# Patient Record
Sex: Female | Born: 1955 | Race: White | Hispanic: No | Marital: Married | Smoking: Never smoker
Health system: Southern US, Community
[De-identification: ages and names within clinical notes are randomized; demographics above are authoritative.]

## PROBLEM LIST (undated history)

## (undated) DIAGNOSIS — E785 Hyperlipidemia, unspecified: Secondary | ICD-10-CM

## (undated) DIAGNOSIS — IMO0002 Reserved for concepts with insufficient information to code with codable children: Secondary | ICD-10-CM

## (undated) DIAGNOSIS — B019 Varicella without complication: Secondary | ICD-10-CM

## (undated) DIAGNOSIS — H269 Unspecified cataract: Secondary | ICD-10-CM

## (undated) DIAGNOSIS — M1990 Unspecified osteoarthritis, unspecified site: Secondary | ICD-10-CM

## (undated) DIAGNOSIS — R011 Cardiac murmur, unspecified: Secondary | ICD-10-CM

## (undated) HISTORY — DX: Hyperlipidemia, unspecified: E78.5

## (undated) HISTORY — DX: Unspecified cataract: H26.9

## (undated) HISTORY — DX: Unspecified osteoarthritis, unspecified site: M19.90

## (undated) HISTORY — PX: JOINT REPLACEMENT: SHX530

## (undated) HISTORY — PX: SPINE SURGERY: SHX786

## (undated) HISTORY — PX: FRACTURE SURGERY: SHX138

## (undated) HISTORY — DX: Varicella without complication: B01.9

## (undated) HISTORY — DX: Cardiac murmur, unspecified: R01.1

## (undated) HISTORY — PX: EYE SURGERY: SHX253

## (undated) HISTORY — DX: Reserved for concepts with insufficient information to code with codable children: IMO0002

---

## 2016-05-05 ENCOUNTER — Ambulatory Visit: Payer: Self-pay | Admitting: Family Medicine

## 2016-05-11 ENCOUNTER — Ambulatory Visit: Payer: Self-pay | Admitting: Family Medicine

## 2016-06-15 ENCOUNTER — Ambulatory Visit (INDEPENDENT_AMBULATORY_CARE_PROVIDER_SITE_OTHER): Payer: Medicare Other | Admitting: Family Medicine

## 2016-06-15 ENCOUNTER — Encounter: Payer: Self-pay | Admitting: Family Medicine

## 2016-06-15 VITALS — BP 130/80 | HR 60 | Resp 12 | Ht 66.25 in | Wt 199.4 lb

## 2016-06-15 DIAGNOSIS — Z6833 Body mass index (BMI) 33.0-33.9, adult: Secondary | ICD-10-CM | POA: Insufficient documentation

## 2016-06-15 DIAGNOSIS — Z6831 Body mass index (BMI) 31.0-31.9, adult: Secondary | ICD-10-CM

## 2016-06-15 DIAGNOSIS — M545 Low back pain, unspecified: Secondary | ICD-10-CM

## 2016-06-15 DIAGNOSIS — M5440 Lumbago with sciatica, unspecified side: Secondary | ICD-10-CM

## 2016-06-15 DIAGNOSIS — E785 Hyperlipidemia, unspecified: Secondary | ICD-10-CM | POA: Diagnosis not present

## 2016-06-15 DIAGNOSIS — Z23 Encounter for immunization: Secondary | ICD-10-CM

## 2016-06-15 DIAGNOSIS — M5410 Radiculopathy, site unspecified: Secondary | ICD-10-CM | POA: Insufficient documentation

## 2016-06-15 LAB — LIPID PANEL
CHOL/HDL RATIO: 4
Cholesterol: 229 mg/dL — ABNORMAL HIGH (ref 0–200)
HDL: 54.9 mg/dL (ref >39.00)
LDL Cholesterol: 152 mg/dL — ABNORMAL HIGH (ref 0–99)
NONHDL: 174.22
TRIGLYCERIDES: 112 mg/dL (ref 0.0–149.0)
VLDL: 22.4 mg/dL (ref 0.0–40.0)

## 2016-06-15 LAB — BASIC METABOLIC PANEL
BUN: 18 mg/dL (ref 6–23)
CALCIUM: 9.4 mg/dL (ref 8.4–10.5)
CHLORIDE: 104 meq/L (ref 96–112)
CO2: 30 mEq/L (ref 19–32)
CREATININE: 0.68 mg/dL (ref 0.40–1.20)
GFR: 94.39 mL/min (ref >60.00)
Glucose, Bld: 94 mg/dL (ref 70–99)
POTASSIUM: 4.4 meq/L (ref 3.5–5.1)
Sodium: 140 mEq/L (ref 135–145)

## 2016-06-15 LAB — TSH: TSH: 1.4 u[IU]/mL (ref 0.35–4.50)
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
## 2016-06-15 NOTE — Progress Notes (Signed)
Pre visit review using our clinic review tool, if applicable. No additional management support is needed unless otherwise documented below in the visit note. 

## 2016-06-15 NOTE — Patient Instructions (Addendum)
A few things to remember from today's visit:   BMI 31.0-31.9,adult - Plan: TSH, Basic metabolic panel  Low back pain potentially associated with radiculopathy - Plan: DG Lumbar Spine Complete  Hyperlipidemia, unspecified hyperlipidemia type - Plan: TSH, Lipid panel   What are some tips for weight loss? People become overweight for many reasons. Weight issues can run in families. They can be caused by unhealthy behaviors and a person's environment. Certain health problems and medicines can also lead to weight gain. There are some simple things you can do to reach and maintain a healthy weight:  Eat small more frequent healthy meals instead 3 bid meals. Also Weight Watchers is a good option. Avoid sweet drinks. These include regular soft drinks, fruit juices, fruit drinks, energy drinks, sweetened iced tea, and flavored milk. Avoid fast foods. Fast foods such as french fries, hamburgers, chicken nuggets, and pizza are high in calories and can cause weight gain. Eat a healthy breakfast. People who skip breakfast tend to weigh more. Don't watch more than two hours of television per day. Chew sugar-free gum between meals to cut down on snacking. Avoid grocery shopping when you're hungry. Pack a healthy lunch instead of eating out to control what and how much you eat. Eat a lot of fruits and vegetables. Aim for about 2 cups of fruit and 2 to 3 cups of vegetables per day. Aim for 150 minutes per week of moderate-intensity exercise (such as brisk walking), or 75 minutes per week of vigorous exercise (such as jogging or running). OR 15-30 min of daily brisk walking. Be more active. Small changes in physical activity can easily be added to your daily routine. For example, take the stairs instead of the elevator. Take a walk with your family. A daily walk is a great way to get exercise and to catch up on the day's events.   We have ordered labs or studies at this visit.  It can take up to 1-2 weeks  for results and processing. IF results require follow up or explanation, we will call you with instructions. Clinically stable results will be released to your [facility]. If you have not heard from us or cannot find your results in [facility] in 2 weeks please contact our office at [phone].  If you are not yet signed up for [facility], please consider signing up   Please schedule a gyn/physical exam with us or with gynecologists. If you want to do it here it can be in 4-6 months.     Please be sure medication list is accurate. If a new problem present, please set up appointment sooner than planned today.

## 2016-06-18 ENCOUNTER — Encounter: Payer: Self-pay | Admitting: Family Medicine

## 2016-06-18 ENCOUNTER — Telehealth: Payer: Self-pay | Admitting: Family Medicine

## 2016-06-18 NOTE — Telephone Encounter (Signed)
Do you have her labs?  Thanks!

## 2016-06-18 NOTE — Telephone Encounter (Signed)
Pt would like results of labs. Please call back. °

## 2016-06-19 NOTE — Telephone Encounter (Signed)
Results were already reviewed and sent through My chart.  Thanks, BJ

## 2016-12-14 ENCOUNTER — Ambulatory Visit: Payer: Medicare Other | Admitting: Family Medicine
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
## 2016-12-21 ENCOUNTER — Ambulatory Visit (INDEPENDENT_AMBULATORY_CARE_PROVIDER_SITE_OTHER): Payer: Medicare Other | Admitting: Family Medicine

## 2016-12-21 ENCOUNTER — Encounter: Payer: Self-pay | Admitting: Family Medicine

## 2016-12-21 VITALS — BP 128/70 | HR 68 | Resp 12 | Ht 66.25 in | Wt 195.2 lb

## 2016-12-21 DIAGNOSIS — Z6831 Body mass index (BMI) 31.0-31.9, adult: Secondary | ICD-10-CM

## 2016-12-21 DIAGNOSIS — M5410 Radiculopathy, site unspecified: Secondary | ICD-10-CM | POA: Diagnosis not present

## 2016-12-21 DIAGNOSIS — G894 Chronic pain syndrome: Secondary | ICD-10-CM | POA: Insufficient documentation

## 2016-12-21 DIAGNOSIS — G8929 Other chronic pain: Secondary | ICD-10-CM

## 2016-12-21 DIAGNOSIS — E785 Hyperlipidemia, unspecified: Secondary | ICD-10-CM | POA: Diagnosis not present

## 2016-12-21 DIAGNOSIS — M25569 Pain in unspecified knee: Secondary | ICD-10-CM | POA: Diagnosis not present

## 2016-12-21 MED ORDER — NALTREXONE-BUPROPION HCL ER 8-90 MG PO TB12: ORAL_TABLET | ORAL | 2 refills | Status: DC

## 2016-12-21 MED ORDER — DULOXETINE HCL 30 MG PO CPEP: 30.0000 mg | ORAL_CAPSULE | Freq: Every day | ORAL | 1 refills | Status: DC

## 2016-12-21 NOTE — Patient Instructions (Addendum)
A few things to remember from today's visit:   Radiculopathy with lower extremity symptoms - Plan: Ambulatory referral to Orthopedic Surgery, DULoxetine (CYMBALTA) 30 MG capsule  BMI 31.0-31.9,adult - Plan: Naltrexone-Bupropion HCl ER 8-90 MG TB12  Hyperlipidemia, unspecified hyperlipidemia type  Chronic knee pain, unspecified laterality - Plan: Ambulatory referral to Orthopedic Surgery  Cymbalta 30 mg to start and 2 weeks later Contrave for wt loss.  Today we started Cymbalta, this type of medications can increase suicidal risk. This is more prevalent among children,adolecents, and young adults with major depression or other psychiatric disorders. It can also make depression worse. Most common side effects are gastrointestinal, self limited after a few weeks: diarrhea, nausea, constipation  Or diarrhea among some.  In general it is well tolerated. We will follow closely.   In about 4-6 weeks please let me know through My Chart or by calling the office about tolerance of new medication.      Please be sure medication list is accurate. If a new problem present, please set up appointment sooner than planned today.

## 2016-12-29 ENCOUNTER — Ambulatory Visit (INDEPENDENT_AMBULATORY_CARE_PROVIDER_SITE_OTHER): Payer: Self-pay | Admitting: Orthopaedic Surgery

## 2017-02-11 ENCOUNTER — Encounter: Payer: Self-pay | Admitting: Family Medicine

## 2017-03-23 ENCOUNTER — Ambulatory Visit: Payer: Medicare Other | Admitting: Family Medicine
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
## 2017-04-14 ENCOUNTER — Encounter: Payer: Self-pay | Admitting: Family Medicine

## 2017-04-14 ENCOUNTER — Ambulatory Visit (INDEPENDENT_AMBULATORY_CARE_PROVIDER_SITE_OTHER): Payer: Medicare Other | Admitting: Family Medicine

## 2017-04-14 VITALS — BP 125/78 | HR 65 | Temp 98.4°F | Resp 12 | Ht 66.25 in | Wt 200.2 lb

## 2017-04-14 DIAGNOSIS — R739 Hyperglycemia, unspecified: Secondary | ICD-10-CM | POA: Diagnosis not present

## 2017-04-14 DIAGNOSIS — E785 Hyperlipidemia, unspecified: Secondary | ICD-10-CM

## 2017-04-14 DIAGNOSIS — Z0000 Encounter for general adult medical examination without abnormal findings: Secondary | ICD-10-CM | POA: Diagnosis not present

## 2017-04-14 DIAGNOSIS — Z9189 Other specified personal risk factors, not elsewhere classified: Secondary | ICD-10-CM | POA: Diagnosis not present

## 2017-04-14 DIAGNOSIS — Z1231 Encounter for screening mammogram for malignant neoplasm of breast: Secondary | ICD-10-CM

## 2017-04-14 DIAGNOSIS — Z1159 Encounter for screening for other viral diseases: Secondary | ICD-10-CM | POA: Diagnosis not present

## 2017-04-14 DIAGNOSIS — Z780 Asymptomatic menopausal state: Secondary | ICD-10-CM

## 2017-04-14 DIAGNOSIS — Z1239 Encounter for other screening for malignant neoplasm of breast: Secondary | ICD-10-CM

## 2017-04-14 DIAGNOSIS — M5410 Radiculopathy, site unspecified: Secondary | ICD-10-CM

## 2017-04-14 DIAGNOSIS — Z23 Encounter for immunization: Secondary | ICD-10-CM

## 2017-04-14 LAB — LIPID PANEL
CHOL/HDL RATIO: 5
Cholesterol: 232 mg/dL — ABNORMAL HIGH (ref 0–200)
HDL: 45.8 mg/dL (ref >39.00)
LDL CALC: 167 mg/dL — AB (ref 0–99)
NonHDL: 186.28
TRIGLYCERIDES: 98 mg/dL (ref 0.0–149.0)
VLDL: 19.6 mg/dL (ref 0.0–40.0)

## 2017-04-14 LAB — BASIC METABOLIC PANEL
BUN: 14 mg/dL (ref 6–23)
CO2: 30 mEq/L (ref 19–32)
Calcium: 9.5 mg/dL (ref 8.4–10.5)
Chloride: 103 mEq/L (ref 96–112)
Creatinine, Ser: 0.7 mg/dL (ref 0.40–1.20)
GFR: 91.02 mL/min (ref >60.00)
Glucose, Bld: 103 mg/dL — ABNORMAL HIGH (ref 70–99)
POTASSIUM: 4.2 meq/L (ref 3.5–5.1)
SODIUM: 140 meq/L (ref 135–145)

## 2017-04-14 LAB — HEMOGLOBIN A1C: Hgb A1c MFr Bld: 5.8 % (ref 4.6–6.5)

## 2017-04-14 NOTE — Patient Instructions (Addendum)
A few things to remember from today's visit:   Routine general medical examination at a health care facility  Medicare annual wellness visit, subsequent  Hyperlipidemia, unspecified hyperlipidemia type - Plan: Lipid panel  Encounter for HCV screening test for high risk patient - Plan: Hepatitis C antibody screen  Diabetes mellitus screening - Plan: Basic metabolic panel, Hemoglobin A1c  Asymptomatic postmenopausal estrogen deficiency - Plan: DEXAScan  Breast cancer screening - Plan: Mammogram Digital Screening  Radiculopathy with lower extremity symptoms - Plan: Ambulatory referral to Orthopedic Surgery   Please be sure medication list is accurate. If a new problem present, please set up appointment sooner than planned today.    A few tips:  -As we age balance is not as good as it was, so there is a higher risks for falls. Please remove small rugs and furniture that is "in your way" and could increase the risk of falls. Stretching exercises may help with fall prevention: Yoga and Tai Chi are some examples. Low impact exercise is better, so you are not very achy the next day.  -Sun screen and avoidance of direct sun light recommended. Caution with dehydration, if working outdoors be sure to drink enough fluids.  - Some medications are not safe as we age, increases the risk of side effects and can potentially interact with other medication you are also taken;  including some of over the counter medications. Be sure to let me know when you start a new medication even if it is a dietary/vitamin supplement.   -Healthy diet low in red meet/animal fat and sugar + regular physical activity is recommended.       Screening schedule for the next 5-10 years:  Colonoscopy in 2025  Glaucoma screening/eye exam every 1-2 years.  Mammogram for breast cancer screening annually.  Flu vaccine annually.  Diabetes and cholesterol screening   Fall prevention Pneumonia vaccines at 41  and 85.    Advance directives:  Please see a lawyer and/or go to this website to help you with advanced directives and designating a health care power of attorney so that your wishes will be followed should you become too ill to make your own medical decisions.  [email or web address]              Why follow it? Research shows. . Those who follow the Mediterranean diet have a reduced risk of heart disease  . The diet is associated with a reduced incidence of Parkinson's and Alzheimer's diseases . People following the diet may have longer life expectancies and lower rates of chronic diseases  . The Dietary Guidelines for Americans recommends the Mediterranean diet as an eating plan to promote health and prevent disease  What Is the Mediterranean Diet?  . Healthy eating plan based on typical foods and recipes of Mediterranean-style cooking . The diet is primarily a plant based diet; these foods should make up a majority of meals   Starches - Plant based foods should make up a majority of meals - They are an important sources of vitamins, minerals, energy, antioxidants, and fiber - Choose whole grains, foods high in fiber and minimally processed items  - Typical grain sources include wheat, oats, barley, corn, brown rice, bulgar, farro, millet, polenta, couscous  - Various types of beans include chickpeas, lentils, fava beans, black beans, white beans   Fruits  Veggies - Large quantities of antioxidant rich fruits & veggies; 6 or more servings  - Vegetables can be eaten raw or lightly drizzled with  oil and cooked  - Vegetables common to the traditional Mediterranean Diet include: artichokes, arugula, beets, broccoli, brussel sprouts, cabbage, carrots, celery, collard greens, cucumbers, eggplant, kale, leeks, lemons, lettuce, mushrooms, okra, onions, peas, peppers, potatoes, pumpkin, radishes, rutabaga, shallots, spinach, sweet potatoes, turnips, zucchini - Fruits  common to the Mediterranean Diet include: apples, apricots, avocados, cherries, clementines, dates, figs, grapefruits, grapes, melons, nectarines, oranges, peaches, pears, pomegranates, strawberries, tangerines  Fats - Replace butter and margarine with healthy oils, such as olive oil, canola oil, and tahini  - Limit nuts to no more than a handful a day  - Nuts include walnuts, almonds, pecans, pistachios, pine nuts  - Limit or avoid candied, honey roasted or heavily salted nuts - Olives are central to the [employer] - can be eaten whole or used in a variety of dishes   Meats Protein - Limiting red meat: no more than a few times a month - When eating red meat: choose lean cuts and keep the portion to the size of deck of cards - Eggs: approx. 0 to 4 times a week  - Fish and lean poultry: at least 2 a week  - Healthy protein sources include, chicken, [place], lean beef, lamb - Increase intake of seafood such as tuna, salmon, trout, mackerel, shrimp, scallops - Avoid or limit high fat processed meats such as sausage and bacon  Dairy - Include moderate amounts of low fat dairy products  - Focus on healthy dairy such as fat free yogurt, skim milk, low or reduced fat cheese - Limit dairy products higher in fat such as whole or 2% milk, cheese, ice cream  Alcohol - Moderate amounts of red wine is ok  - No more than 5 oz daily for women (all ages) and men older than age 30  - No more than 10 oz of wine daily for men younger than 44  Other - Limit sweets and other desserts  - Use herbs and spices instead of salt to flavor foods  - Herbs and spices common to the traditional Mediterranean Diet include: basil, bay leaves, chives, cloves, cumin, fennel, garlic, lavender, marjoram, mint, oregano, parsley, pepper, rosemary, sage, savory, sumac, tarragon, thyme   It's not just a diet, it's a lifestyle:  . The Mediterranean diet includes lifestyle factors typical of those in the region  . Foods, drinks  and meals are best eaten with others and savored . Daily physical activity is important for overall good health . This could be strenuous exercise like running and aerobics . This could also be more leisurely activities such as walking, housework, yard-work, or taking the stairs . Moderation is the key; a balanced and healthy diet accommodates most foods and drinks . Consider portion sizes and frequency of consumption of certain foods   Meal Ideas & Options:  . Breakfast:  o Whole wheat toast or whole wheat English muffins with peanut butter & hard boiled egg o Steel cut oats topped with apples & cinnamon and skim milk  o Fresh fruit: banana, strawberries, melon, berries, peaches  o Smoothies: strawberries, bananas, greek yogurt, peanut butter o Low fat greek yogurt with blueberries and granola  o Egg white omelet with spinach and mushrooms o Breakfast couscous: whole wheat couscous, apricots, skim milk, cranberries  . Sandwiches:  o Hummus and grilled vegetables (peppers, zucchini, squash) on whole wheat bread   o Grilled chicken on whole wheat pita with lettuce, tomatoes, cucumbers or tzatziki  o Tuna salad on whole wheat bread: tuna  salad made with greek yogurt, olives, red peppers, capers, green onions o Garlic rosemary lamb pita: lamb sauted with garlic, rosemary, salt & pepper; add lettuce, cucumber, greek yogurt to pita - flavor with lemon juice and black pepper  . Seafood:  o Mediterranean grilled salmon, seasoned with garlic, basil, parsley, lemon juice and black pepper o Shrimp, lemon, and spinach whole-grain pasta salad made with low fat greek yogurt  o Seared scallops with lemon orzo  o Seared tuna steaks seasoned salt, pepper, coriander topped with tomato mixture of olives, tomatoes, olive oil, minced garlic, parsley, green onions and cappers  . Meats:  o Herbed greek chicken salad with kalamata olives, cucumber, feta  o Red bell peppers stuffed with spinach, bulgur, lean  ground beef (or lentils) & topped with feta   o Kebabs: skewers of chicken, tomatoes, onions, zucchini, squash  o [place] burgers: made with red onions, mint, dill, lemon juice, feta cheese topped with roasted red peppers . Vegetarian o Cucumber salad: cucumbers, artichoke hearts, celery, red onion, feta cheese, tossed in olive oil & lemon juice  o Hummus and whole grain pita points with a greek salad (lettuce, tomato, feta, olives, cucumbers, red onion) o Lentil soup with celery, carrots made with vegetable broth, garlic, salt and pepper  o Tabouli salad: parsley, bulgur, mint, scallions, cucumbers, tomato, radishes, lemon juice, olive oil, salt and pepper.

## 2017-04-15 LAB — HEPATITIS C ANTIBODY
HEP C AB: NONREACTIVE
SIGNAL TO CUT-OFF: 0.01 (ref <1.00)

## 2017-04-16 ENCOUNTER — Encounter: Payer: Self-pay | Admitting: Family Medicine

## 2017-05-19 ENCOUNTER — Telehealth: Payer: Self-pay | Admitting: Family Medicine

## 2017-05-19 NOTE — Telephone Encounter (Signed)
Copied from CRM [id number]. Topic: Quick Communication - See Telephone Encounter >> May 19, 2017 11:00 AM Suzanne [name], Rosey Batheresa D wrote: CRM for notification. See Telephone encounter for: 05/19/17. Patient called and would like to talk to provider or CMA about issues she has about my chart and her lab results and other stuff. Patient is upset due to access problems with her mychart. Please call patient back, thanks.

## 2017-05-20 ENCOUNTER — Telehealth: Payer: Self-pay

## 2017-05-20 NOTE — Telephone Encounter (Signed)
Fine with me

## 2017-05-20 NOTE — Telephone Encounter (Signed)
This message is to ask permission from both Dr. SwazilandJordan and Dr. Dayton MartesAron for pt to be able to transfer care to Dr. Dayton MartesAron due to convenience in respect to where she lives/Plz advise/thx dmf   Copied from CRM [id number]. Topic: Appointment Scheduling - Scheduling Inquiry for Clinic >> May 19, 2017 11:16 AM Crist InfanteHarrald, Kathy J wrote: Reason for CRM: pt would like to switch to Dr Dayton MartesAron due to her living in [place], [place].  Is that OK with you Dr Swazil[place]Jordan? Ok with you Dr Dayton MartesAron?

## 2017-05-20 NOTE — Telephone Encounter (Signed)
Okay with me 

## 2017-05-21 ENCOUNTER — Other Ambulatory Visit: Payer: Self-pay | Admitting: Family Medicine

## 2017-05-21 DIAGNOSIS — E2839 Other primary ovarian failure: Secondary | ICD-10-CM

## 2017-05-21 NOTE — Telephone Encounter (Signed)
Patient is now scheduled with TA/mailed copy of Nov labs to her/gave her the [facility] to [facility] Mamm & BMD/Fixed MyChart issue so she will be able to get on line now/also scheduled her spouse with Dr. Synetta FailKremer/thx dmf

## 2017-05-24 NOTE — Telephone Encounter (Signed)
Tried calling patient, no answer.  Will try calling patient again at a later date.

## 2017-05-26 NOTE — Telephone Encounter (Signed)
Returned called to patient. Patient upset that someone told her that she would get a call in between patients and did not receive one. Patient stated that her issues have been resolved and that she has switched to a new provider. Informed patient that Dr. Sw[place]il[place]Jordan was out of the office as well as myself last week and apologized for the misunderstanding.

## 2017-06-08 ENCOUNTER — Ambulatory Visit: Payer: Medicare Other | Admitting: Family Medicine

## 2017-06-15 ENCOUNTER — Other Ambulatory Visit: Payer: Self-pay | Admitting: Family Medicine

## 2017-06-15 ENCOUNTER — Ambulatory Visit
Admission: RE | Admit: 2017-06-15 | Discharge: 2017-06-15 | Disposition: A | Discharge status: Home / Self Care | Payer: Medicare Other | Source: Ambulatory Visit | Attending: Family Medicine | Admitting: Family Medicine

## 2017-06-15 DIAGNOSIS — E2839 Other primary ovarian failure: Secondary | ICD-10-CM

## 2017-06-15 DIAGNOSIS — Z1231 Encounter for screening mammogram for malignant neoplasm of breast: Secondary | ICD-10-CM | POA: Diagnosis not present

## 2017-06-15 DIAGNOSIS — M85851 Other specified disorders of bone density and structure, right thigh: Secondary | ICD-10-CM | POA: Diagnosis not present

## 2017-06-15 DIAGNOSIS — Z780 Asymptomatic menopausal state: Secondary | ICD-10-CM | POA: Diagnosis not present

## 2017-06-15 DIAGNOSIS — Z1239 Encounter for other screening for malignant neoplasm of breast: Secondary | ICD-10-CM

## 2017-06-16 ENCOUNTER — Encounter: Payer: Self-pay | Admitting: Family Medicine

## 2018-04-17 DIAGNOSIS — M21371 Foot drop, right foot: Secondary | ICD-10-CM | POA: Insufficient documentation
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
## 2018-04-18 ENCOUNTER — Encounter: Payer: Medicare Other | Admitting: Family Medicine

## 2018-04-18 DIAGNOSIS — Z0289 Encounter for other administrative examinations: Secondary | ICD-10-CM

## 2019-01-18 DIAGNOSIS — H25819 Combined forms of age-related cataract, unspecified eye: Secondary | ICD-10-CM | POA: Insufficient documentation

## 2019-03-08 DIAGNOSIS — Z961 Presence of intraocular lens: Secondary | ICD-10-CM | POA: Insufficient documentation

## 2019-03-08 DIAGNOSIS — H18519 Endothelial corneal dystrophy, unspecified eye: Secondary | ICD-10-CM | POA: Insufficient documentation

## 2019-04-05 DIAGNOSIS — Z961 Presence of intraocular lens: Secondary | ICD-10-CM | POA: Insufficient documentation

## 2020-06-07 ENCOUNTER — Ambulatory Visit: Payer: Self-pay | Admitting: Adult Health

## 2020-07-17 ENCOUNTER — Ambulatory Visit (INDEPENDENT_AMBULATORY_CARE_PROVIDER_SITE_OTHER): Payer: Medicare Other | Admitting: Adult Health

## 2020-07-17 ENCOUNTER — Other Ambulatory Visit: Payer: Self-pay

## 2020-07-17 ENCOUNTER — Encounter: Payer: Self-pay | Admitting: Adult Health

## 2020-07-17 VITALS — BP 137/56 | HR 64 | Temp 98.3°F | Ht 66.0 in | Wt 207.4 lb

## 2020-07-17 DIAGNOSIS — L819 Disorder of pigmentation, unspecified: Secondary | ICD-10-CM

## 2020-07-17 DIAGNOSIS — G4700 Insomnia, unspecified: Secondary | ICD-10-CM

## 2020-07-17 DIAGNOSIS — Z6833 Body mass index (BMI) 33.0-33.9, adult: Secondary | ICD-10-CM

## 2020-07-17 DIAGNOSIS — M546 Pain in thoracic spine: Secondary | ICD-10-CM | POA: Diagnosis not present

## 2020-07-17 DIAGNOSIS — Z96651 Presence of right artificial knee joint: Secondary | ICD-10-CM | POA: Insufficient documentation

## 2020-07-17 DIAGNOSIS — R0683 Snoring: Secondary | ICD-10-CM

## 2020-07-17 DIAGNOSIS — G8929 Other chronic pain: Secondary | ICD-10-CM

## 2020-07-17 DIAGNOSIS — R635 Abnormal weight gain: Secondary | ICD-10-CM

## 2020-07-17 DIAGNOSIS — Z1389 Encounter for screening for other disorder: Secondary | ICD-10-CM

## 2020-07-17 DIAGNOSIS — Z1211 Encounter for screening for malignant neoplasm of colon: Secondary | ICD-10-CM

## 2020-07-17 DIAGNOSIS — R202 Paresthesia of skin: Secondary | ICD-10-CM

## 2020-07-17 DIAGNOSIS — Z1231 Encounter for screening mammogram for malignant neoplasm of breast: Secondary | ICD-10-CM

## 2020-07-17 DIAGNOSIS — Z1212 Encounter for screening for malignant neoplasm of rectum: Secondary | ICD-10-CM

## 2020-07-17 MED ORDER — TRAZODONE HCL 50 MG PO TABS: 25.0000 mg | ORAL_TABLET | Freq: Every evening | ORAL | 0 refills | Status: DC | PRN

## 2020-07-17 NOTE — Patient Instructions (Addendum)
Call to schedule your screening mammogram. Your orders have been placed for your exam.  Let our office know if you have questions, concerns, or any difficulty scheduling.  If normal results then yearly screening mammograms are recommended unless you notice  Changes in your breast then you should schedule a follow up office visit. If abnormal results  Further imaging will be warranted and sooner follow up as determined by the radiologist at the [facility].   [facility] [address] [place], [place] [address]  Main: [phone]    Trazodone Tablets What is this medicine? TRAZODONE (TRAZ oh done) is used to treat depression. This medicine may be used for other purposes; ask your health care provider or pharmacist if you have questions. COMMON BRAND NAME(S): Desyrel What should I tell my health care provider before I take this medicine? They need to know if you have any of these conditions:  attempted suicide or thinking about it  bipolar disorder  bleeding problems  glaucoma  heart disease, or previous heart attack  irregular heart beat  kidney or liver disease  low levels of sodium in the blood  an unusual or allergic reaction to trazodone, other medicines, foods, dyes or preservatives  pregnant or trying to get pregnant  breast-feeding How should I use this medicine? Take this medicine by mouth with a glass of water. Follow the directions on the prescription label. Take this medicine shortly after a meal or a light snack. Take your medicine at regular intervals. Do not take your medicine more often than directed. Do not stop taking this medicine suddenly except upon the advice of your doctor. Stopping this medicine too quickly may cause serious side effects or your condition may worsen. A special MedGuide will be given to you by the pharmacist with each prescription and refill. Be sure to read this information carefully each  time. Talk to your pediatrician regarding the use of this medicine in children. Special care may be needed. Overdosage: If you think you have taken too much of this medicine contact a poison control center or emergency room at once. NOTE: This medicine is only for you. Do not share this medicine with others. What if I miss a dose? If you miss a dose, take it as soon as you can. If it is almost time for your next dose, take only that dose. Do not take double or extra doses. What may interact with this medicine? Do not take this medicine with any of the following medications:  certain medicines for fungal infections like fluconazole, itraconazole, ketoconazole, posaconazole, voriconazole  cisapride  dronedarone  linezolid  MAOIs like Carbex, Eldepryl, Marplan, Nardil, and Parnate  mesoridazine  methylene blue (injected into a vein)  pimozide  saquinavir  thioridazine This medicine may also interact with the following medications:  alcohol  antiviral medicines for HIV or AIDS  aspirin and aspirin-like medicines  barbiturates like phenobarbital  certain medicines for blood pressure, heart disease, irregular heart beat  certain medicines for depression, anxiety, or psychotic disturbances  certain medicines for migraine headache like almotriptan, eletriptan, frovatriptan, naratriptan, rizatriptan, sumatriptan, zolmitriptan  certain medicines for seizures like carbamazepine and phenytoin  certain medicines for sleep  certain medicines that treat or prevent blood clots like dalteparin, enoxaparin, warfarin  digoxin  fentanyl  lithium  NSAIDS, medicines for pain and inflammation, like ibuprofen or naproxen  other medicines that prolong the QT interval (cause an abnormal heart rhythm) like dofetilide  rasagiline  supplements like St. John's wort, kava kava, valerian  tramadol  tryptophan This list may not describe all possible interactions. Give your health  care provider a list of all the medicines, herbs, non-prescription drugs, or dietary supplements you use. Also tell them if you smoke, drink alcohol, or use illegal drugs. Some items may interact with your medicine. What should I watch for while using this medicine? Tell your doctor if your symptoms do not get better or if they get worse. Visit your doctor or health care professional for regular checks on your progress. Because it may take several weeks to see the full effects of this medicine, it is important to continue your treatment as prescribed by your doctor. Patients and their families should watch out for new or worsening thoughts of suicide or depression. Also watch out for sudden changes in feelings such as feeling anxious, agitated, panicky, irritable, hostile, aggressive, impulsive, severely restless, overly excited and hyperactive, or not being able to sleep. If this happens, especially at the beginning of treatment or after a change in dose, call your health care professional. Suzanne [name] may get drowsy or dizzy. Do not drive, use machinery, or do anything that needs mental alertness until you know how this medicine affects you. Do not stand or sit up quickly, especially if you are an older patient. This reduces the risk of dizzy or fainting spells. Alcohol may interfere with the effect of this medicine. Avoid alcoholic drinks. This medicine may cause dry eyes and blurred vision. If you wear contact lenses you may feel some discomfort. Lubricating drops may help. See your eye doctor if the problem does not go away or is severe. Your mouth may get dry. Chewing sugarless gum, sucking hard candy and drinking plenty of water may help. Contact your doctor if the problem does not go away or is severe. What side effects may I notice from receiving this medicine? Side effects that you should report to your doctor or health care professional as soon as possible:  allergic reactions like skin rash, itching or  hives, swelling of the face, lips, or tongue  elevated mood, decreased need for sleep, racing thoughts, impulsive behavior  confusion  fast, irregular heartbeat  feeling faint or lightheaded, falls  feeling agitated, angry, or irritable  loss of balance or coordination  painful or prolonged erections  restlessness, pacing, inability to keep still  suicidal thoughts or other mood changes  tremors  trouble sleeping  seizures  unusual bleeding or bruising Side effects that usually do not require medical attention (report to your doctor or health care professional if they continue or are bothersome):  change in sex drive or performance  change in appetite or weight  constipation  headache  muscle aches or pains  nausea This list may not describe all possible side effects. Call your doctor for medical advice about side effects. You may report side effects to FDA at 1-800-FDA-1088. Where should I keep my medicine? Keep out of the reach of children. Store at room temperature between 15 and 30 degrees C (59 to 86 degrees F). Protect from light. Keep container tightly closed. Throw away any unused medicine after the expiration date. NOTE: This sheet is a summary. It may not cover all possible information. If you have questions about this medicine, talk to your doctor, pharmacist, or health care provider.  2021 Elsevier/Gold Standard (2020-04-01 14:46:11)  Insomnia Insomnia is a sleep disorder that makes it difficult to fall asleep or stay asleep. Insomnia can cause  fatigue, low energy, difficulty concentrating, mood swings, and poor performance at work or school. There are three different ways to classify insomnia:  Difficulty falling asleep.  Difficulty staying asleep.  Waking up too early in the morning. Any type of insomnia can be long-term (chronic) or short-term (acute). Both are common. Short-term insomnia usually lasts for three months or less. Chronic insomnia  occurs at least three times a week for longer than three months. What are the causes? Insomnia may be caused by another condition, situation, or substance, such as:  Anxiety.  Certain medicines.  Gastroesophageal reflux disease (GERD) or other gastrointestinal conditions.  Asthma or other breathing conditions.  Restless legs syndrome, sleep apnea, or other sleep disorders.  Chronic pain.  Menopause.  Stroke.  Abuse of alcohol, tobacco, or illegal drugs.  Mental health conditions, such as depression.  Caffeine.  Neurological disorders, such as Alzheimer's disease.  An overactive thyroid (hyperthyroidism). Sometimes, the cause of insomnia may not be known. What increases the risk? Risk factors for insomnia include:  Gender. Women are affected more often than men.  Age. Insomnia is more common as you get older.  Stress.  Lack of exercise.  Irregular work schedule or working night shifts.  Traveling between different time zones.  Certain medical and mental health conditions. What are the signs or symptoms? If you have insomnia, the main symptom is having trouble falling asleep or having trouble staying asleep. This may lead to other symptoms, such as:  Feeling fatigued or having low energy.  Feeling nervous about going to sleep.  Not feeling rested in the morning.  Having trouble concentrating.  Feeling irritable, anxious, or depressed. How is this diagnosed? This condition may be diagnosed based on:  Your symptoms and medical history. Your health care provider may ask about: ? Your sleep habits. ? Any medical conditions you have. ? Your mental health.  A physical exam. How is this treated? Treatment for insomnia depends on the cause. Treatment may focus on treating an underlying condition that is causing insomnia. Treatment may also include:  Medicines to help you sleep.  Counseling or therapy.  Lifestyle adjustments to help you sleep  better. Follow these instructions at home: Eating and drinking  Limit or avoid alcohol, caffeinated beverages, and cigarettes, especially close to bedtime. These can disrupt your sleep.  Do not eat a large meal or eat spicy foods right before bedtime. This can lead to digestive discomfort that can make it hard for you to sleep.   Sleep habits  Keep a sleep diary to help you and your health care provider figure out what could be causing your insomnia. Write down: ? When you sleep. ? When you wake up during the night. ? How well you sleep. ? How rested you feel the next day. ? Any side effects of medicines you are taking. ? What you eat and drink.  Make your bedroom a dark, comfortable place where it is easy to fall asleep. ? Put up shades or blackout curtains to block light from outside. ? Use a white noise machine to block noise. ? Keep the temperature cool.  Limit screen use before bedtime. This includes: ? Watching TV. ? Using your smartphone, tablet, or computer.  Stick to a routine that includes going to bed and waking up at the same times every day and night. This can help you fall asleep faster. Consider making a quiet activity, such as reading, part of your nighttime routine.  Try to avoid taking naps  during the day so that you sleep better at night.  Get out of bed if you are still awake after 15 minutes of trying to sleep. Keep the lights down, but try reading or doing a quiet activity. When you feel sleepy, go back to bed.   General instructions  Take over-the-counter and prescription medicines only as told by your health care provider.  Exercise regularly, as told by your health care provider. Avoid exercise starting several hours before bedtime.  Use relaxation techniques to manage stress. Ask your health care provider to suggest some techniques that may work well for you. These may include: ? Breathing exercises. ? Routines to release muscle tension. ? Visualizing  peaceful scenes.  Make sure that you drive carefully. Avoid driving if you feel very sleepy.  Keep all follow-up visits as told by your health care provider. This is important. Contact a health care provider if:  You are tired throughout the day.  You have trouble in your daily routine due to sleepiness.  You continue to have sleep problems, or your sleep problems get worse. Get help right away if:  You have serious thoughts about hurting yourself or someone else. If you ever feel like you may hurt yourself or others, or have thoughts about taking your own life, get help right away. You can go to your nearest emergency department or call:  Your local emergency services (911 in the [place]  A suicide crisis helpline, such as the National Suicide Prevention Lifeline at 9315493592. This is open 24 hours a day. Summary  Insomnia is a sleep disorder that makes it difficult to fall asleep or stay asleep.  Insomnia can be long-term (chronic) or short-term (acute).  Treatment for insomnia depends on the cause. Treatment may focus on treating an underlying condition that is causing insomnia.  Keep a sleep diary to help you and your health care provider figure out what could be causing your insomnia. This information is not intended to replace advice given to you by your health care provider. Make sure you discuss any questions you have with your health care provider. Document Revised: 03/21/2020 Document Reviewed: 03/21/2020 Elsevier Patient Education  2021 Elsevier Inc.   Health Maintenance, Female Adopting a healthy lifestyle and getting preventive care are important in promoting health and wellness. Ask your health care provider about:  The right schedule for you to have regular tests and exams.  Things you can do on your own to prevent diseases and keep yourself healthy. What should I know about diet, weight, and exercise? Eat a healthy diet  Eat a diet that includes plenty  of vegetables, fruits, low-fat dairy products, and lean protein.  Do not eat a lot of foods that are high in solid fats, added sugars, or sodium.   Maintain a healthy weight Body mass index (BMI) is used to identify weight problems. It estimates body fat based on height and weight. Your health care provider can help determine your BMI and help you achieve or maintain a healthy weight. Get regular exercise Get regular exercise. This is one of the most important things you can do for your health. Most adults should:  Exercise for at least 150 minutes each week. The exercise should increase your heart rate and make you sweat (moderate-intensity exercise).  Do strengthening exercises at least twice a week. This is in addition to the moderate-intensity exercise.  Spend less time sitting. Even light physical activity can be beneficial. Watch cholesterol and blood lipids Have your  blood tested for lipids and cholesterol at 63 years of age, then have this test every 5 years. Have your cholesterol levels checked more often if:  Your lipid or cholesterol levels are high.  You are older than 63 years of age.  You are at high risk for heart disease. What should I know about cancer screening? Depending on your health history and family history, you may need to have cancer screening at various ages. This may include screening for:  Breast cancer.  Cervical cancer.  Colorectal cancer.  Skin cancer.  Lung cancer. What should I know about heart disease, diabetes, and high blood pressure? Blood pressure and heart disease  High blood pressure causes heart disease and increases the risk of stroke. This is more likely to develop in people who have high blood pressure readings, are of African descent, or are overweight.  Have your blood pressure checked: ? Every 3-5 years if you are 56-90 years of age. ? Every year if you are 61 years old or older. Diabetes Have regular diabetes screenings. This  checks your fasting blood sugar level. Have the screening done:  Once every three years after age 38 if you are at a normal weight and have a low risk for diabetes.  More often and at a younger age if you are overweight or have a high risk for diabetes. What should I know about preventing infection? Hepatitis B If you have a higher risk for hepatitis B, you should be screened for this virus. Talk with your health care provider to find out if you are at risk for hepatitis B infection. Hepatitis C Testing is recommended for:  Everyone born from [birth year] through 1963.  Anyone with known risk factors for hepatitis C. Sexually transmitted infections (STIs)  Get screened for STIs, including gonorrhea and chlamydia, if: ? You are sexually active and are younger than 63 years of age. ? You are older than 63 years of age and your health care provider tells you that you are at risk for this type of infection. ? Your sexual activity has changed since you were last screened, and you are at increased risk for chlamydia or gonorrhea. Ask your health care provider if you are at risk.  Ask your health care provider about whether you are at high risk for HIV. Your health care provider may recommend a prescription medicine to help prevent HIV infection. If you choose to take medicine to prevent HIV, you should first get tested for HIV. You should then be tested every 3 months for as long as you are taking the medicine. Pregnancy  If you are about to stop having your period (premenopausal) and you may become pregnant, seek counseling before you get pregnant.  Take 400 to 800 micrograms (mcg) of folic acid every day if you become pregnant.  Ask for birth control (contraception) if you want to prevent pregnancy. Osteoporosis and menopause Osteoporosis is a disease in which the bones lose minerals and strength with aging. This can result in bone fractures. If you are 66 years old or older, or if you are at risk  for osteoporosis and fractures, ask your health care provider if you should:  Be screened for bone loss.  Take a calcium or vitamin D supplement to lower your risk of fractures.  Be given hormone replacement therapy (HRT) to treat symptoms of menopause. Follow these instructions at home: Lifestyle  Do not use any products that contain nicotine or tobacco, such as cigarettes, e-cigarettes, and  chewing tobacco. If you need help quitting, ask your health care provider.  Do not use street drugs.  Do not share needles.  Ask your health care provider for help if you need support or information about quitting drugs. Alcohol use  Do not drink alcohol if: ? Your health care provider tells you not to drink. ? You are pregnant, may be pregnant, or are planning to become pregnant.  If you drink alcohol: ? Limit how much you use to 0-1 drink a day. ? Limit intake if you are breastfeeding.  Be aware of how much alcohol is in your drink. In the [place] one drink equals one 12 oz bottle of beer (355 mL), one 5 oz glass of wine (148 mL), or one 1 oz glass of hard liquor (44 mL). General instructions  Schedule regular health, dental, and eye exams.  Stay current with your vaccines.  Tell your health care provider if: ? You often feel depressed. ? You have ever been abused or do not feel safe at home. Summary  Adopting a healthy lifestyle and getting preventive care are important in promoting health and wellness.  Follow your health care provider's instructions about healthy diet, exercising, and getting tested or screened for diseases.  Follow your health care provider's instructions on monitoring your cholesterol and blood pressure. This information is not intended to replace advice given to you by your health care provider. Make sure you discuss any questions you have with your health care provider. Document Revised: 05/04/2018 Document Reviewed: 05/04/2018 Elsevier Patient Education   2021 [employer]
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
## 2020-07-19 LAB — CBC WITH DIFFERENTIAL/PLATELET
Basophils Absolute: 0.1 10*3/uL (ref 0.0–0.2)
Basos: 1 %
EOS (ABSOLUTE): 0.2 10*3/uL (ref 0.0–0.4)
Eos: 4 %
Hematocrit: 43.5 % (ref 34.0–46.6)
Hemoglobin: 14.9 g/dL (ref 11.1–15.9)
Immature Grans (Abs): 0 10*3/uL (ref 0.0–0.1)
Immature Granulocytes: 0 %
Lymphocytes Absolute: 2.1 10*3/uL (ref 0.7–3.1)
Lymphs: 36 %
MCH: 31 pg (ref 26.6–33.0)
MCHC: 34.3 g/dL (ref 31.5–35.7)
MCV: 90 fL (ref 79–97)
Monocytes Absolute: 0.6 10*3/uL (ref 0.1–0.9)
Monocytes: 11 %
Neutrophils Absolute: 2.8 10*3/uL (ref 1.4–7.0)
Neutrophils: 48 %
Platelets: 242 10*3/uL (ref 150–450)
RBC: 4.81 x10E6/uL (ref 3.77–5.28)
RDW: 11.9 % (ref 11.7–15.4)
WBC: 5.8 10*3/uL (ref 3.4–10.8)

## 2020-07-19 LAB — COMPREHENSIVE METABOLIC PANEL
ALT: 47 IU/L — ABNORMAL HIGH (ref 0–32)
AST: 29 IU/L (ref 0–40)
Albumin/Globulin Ratio: 2 (ref 1.2–2.2)
Albumin: 4.6 g/dL (ref 3.8–4.8)
Alkaline Phosphatase: 83 IU/L (ref 44–121)
BUN/Creatinine Ratio: 21 (ref 12–28)
BUN: 15 mg/dL (ref 8–27)
Bilirubin Total: 0.5 mg/dL (ref 0.0–1.2)
CO2: 24 mmol/L (ref 20–29)
Calcium: 9.5 mg/dL (ref 8.7–10.3)
Chloride: 104 mmol/L (ref 96–106)
Creatinine, Ser: 0.71 mg/dL (ref 0.57–1.00)
GFR calc Af Amer: 106 mL/min/{1.73_m2} (ref >59)
GFR calc non Af Amer: 92 mL/min/{1.73_m2} (ref >59)
Globulin, Total: 2.3 g/dL (ref 1.5–4.5)
Glucose: 106 mg/dL — ABNORMAL HIGH (ref 65–99)
Potassium: 4.9 mmol/L (ref 3.5–5.2)
Sodium: 141 mmol/L (ref 134–144)
Total Protein: 6.9 g/dL (ref 6.0–8.5)

## 2020-07-19 LAB — LIPID PANEL W/O CHOL/HDL RATIO
Cholesterol, Total: 254 mg/dL — ABNORMAL HIGH (ref 100–199)
HDL: 56 mg/dL (ref >39)
LDL Chol Calc (NIH): 181 mg/dL — ABNORMAL HIGH (ref 0–99)
Triglycerides: 96 mg/dL (ref 0–149)
VLDL Cholesterol Cal: 17 mg/dL (ref 5–40)

## 2020-07-19 LAB — HEMOGLOBIN A1C
Est. average glucose Bld gHb Est-mCnc: 128 mg/dL
Hgb A1c MFr Bld: 6.1 % — ABNORMAL HIGH (ref 4.8–5.6)

## 2020-07-19 LAB — VITAMIN D 25 HYDROXY (VIT D DEFICIENCY, FRACTURES): Vit D, 25-Hydroxy: 23.9 ng/mL — ABNORMAL LOW (ref 30.0–100.0)

## 2020-07-19 LAB — VITAMIN B12: Vitamin B-12: 797 pg/mL (ref 232–1245)

## 2020-07-19 LAB — TSH: TSH: 3.04 u[IU]/mL (ref 0.450–4.500)

## 2020-07-19 NOTE — Progress Notes (Signed)
CBC within normal limits, CMP with elevated glucose 106, A1C is 6.1 prediabetes range dietary changes and increased exercise to prevent diabetes advised. ALT liver enzyme mild elevation, avoid any excessive alcohol or tylenol.  TSH for thyroid within normal limits.  Total cholesterol and LDL elevated.  Discuss lifestyle modification with patient e.g. increase exercise, fiber, fruits, vegetables, lean meat, and omega 3/fish intake and decrease saturated fat.  If patient following strict diet and exercise program already please schedule follow up appointment with primary care physician  Vitamin D is low, recommend starting Vitamin D at 4,000 international units by mouth daily and recheck level in 6 months.   Recheck in 6 month CMP and A1C, Vitamin D, lipid panel in 6 months please add lab orders.

## 2020-08-12 ENCOUNTER — Encounter: Payer: Self-pay | Admitting: *Deleted

## 2020-08-13 ENCOUNTER — Ambulatory Visit
Admission: RE | Admit: 2020-08-13 | Discharge: 2020-08-13 | Disposition: A | Discharge status: Home / Self Care | Payer: Medicare Other | Source: Ambulatory Visit | Attending: Adult Health | Admitting: Adult Health

## 2020-08-13 ENCOUNTER — Other Ambulatory Visit: Payer: Self-pay

## 2020-08-13 DIAGNOSIS — Z1231 Encounter for screening mammogram for malignant neoplasm of breast: Secondary | ICD-10-CM | POA: Insufficient documentation

## 2020-08-14 ENCOUNTER — Other Ambulatory Visit: Payer: Self-pay | Admitting: Adult Health

## 2020-08-14 DIAGNOSIS — G4700 Insomnia, unspecified: Secondary | ICD-10-CM

## 2020-08-14 NOTE — Telephone Encounter (Signed)
Requested Prescriptions  Pending Prescriptions Disp Refills  . traZODone (DESYREL) 50 MG tablet [Pharmacy Med Name: TRAZODONE 50MG  TABLETS] 90 tablet 0    Sig: TAKE 1/2 TO 1 TABLET(25 TO 50 MG) BY MOUTH AT BEDTIME AS NEEDED FOR SLEEP. WILL CAUSE DROWSINESS     Psychiatry: Antidepressants - Serotonin Modulator Passed - 08/14/2020  3:28 AM      Passed - Valid encounter within last 6 months    Recent Outpatient Visits          4 weeks ago Atypical pigmented skin lesion   [facility] Flinchum, 08/16/2020, FNP      Future Appointments            Tomorrow Flinchum, Eula Fried, FNP [facility]

## 2020-08-15 ENCOUNTER — Encounter: Payer: Self-pay | Admitting: Adult Health

## 2020-08-15 ENCOUNTER — Ambulatory Visit (INDEPENDENT_AMBULATORY_CARE_PROVIDER_SITE_OTHER): Payer: Medicare Other | Admitting: Adult Health

## 2020-08-15 ENCOUNTER — Other Ambulatory Visit: Payer: Self-pay

## 2020-08-15 VITALS — BP 140/63 | HR 61 | Resp 16 | Wt 205.0 lb

## 2020-08-15 DIAGNOSIS — R7303 Prediabetes: Secondary | ICD-10-CM

## 2020-08-15 DIAGNOSIS — M5442 Lumbago with sciatica, left side: Secondary | ICD-10-CM

## 2020-08-15 DIAGNOSIS — E785 Hyperlipidemia, unspecified: Secondary | ICD-10-CM

## 2020-08-15 DIAGNOSIS — R202 Paresthesia of skin: Secondary | ICD-10-CM | POA: Diagnosis not present

## 2020-08-15 DIAGNOSIS — G4700 Insomnia, unspecified: Secondary | ICD-10-CM | POA: Diagnosis not present

## 2020-08-15 DIAGNOSIS — G8929 Other chronic pain: Secondary | ICD-10-CM

## 2020-08-15 DIAGNOSIS — E559 Vitamin D deficiency, unspecified: Secondary | ICD-10-CM

## 2020-08-15 MED ORDER — EZETIMIBE 10 MG PO TABS: 10.0000 mg | ORAL_TABLET | Freq: Every day | ORAL | 3 refills | Status: DC

## 2020-08-15 MED ORDER — VITAMIN D (ERGOCALCIFEROL) 1.25 MG (50000 UNIT) PO CAPS: 50000.0000 [IU] | ORAL_CAPSULE | ORAL | 0 refills | Status: DC

## 2020-08-15 MED ORDER — TRAZODONE HCL 50 MG PO TABS
50.0000 mg | ORAL_TABLET | Freq: Every evening | ORAL | 0 refills | Status: DC | PRN
Start: 2020-08-15 — End: 2020-11-20

## 2020-08-15 MED ORDER — GABAPENTIN 100 MG PO CAPS: 100.0000 mg | ORAL_CAPSULE | Freq: Three times a day (TID) | ORAL | 0 refills | Status: DC

## 2020-08-15 NOTE — Progress Notes (Signed)
Established patient visit   Patient: Suzanne [name]   DOB: 21-Dec-1955   65 y.o. Female  MRN: [id number] Visit Date: 08/15/2020  Today's healthcare provider: Jairo Ben, FNP   Chief Complaint  Patient presents with  . Follow-up   Subjective    HPI  Follow up for Insomnia  The patient was last seen for this 1 months ago. Changes made at last visit include referral was made for neurolgy and patient was started on Trazodone 50mg .She reports she falls asleep but not until 3 hours she has recently increased to 50 mg.   She reports fair compliance with treatment. She feels that condition is Improved. She is not having side effects.   Patient reports that she has a sleep study scheduled 08/19/20, she also reports that she did see GI since last visit and is holding off on colonoscopy because last reported was 8years ago.  Colonoscopy was 8 years ago so due in 2024. Mammogram was done results are pending.    She still has chronic nerve pain and left foot drop chronic for years treated in past with Neurontin would like to try Neurontin for nerve pain again. She is trying to walk with her husband but this limits her. She has chronic back pain.   She has  Been trying to work on her cholesterol,. She does not want statin but willing to try Zetia.    Patient  denies any fever,chills, rash, chest pain, shortness of breath, nausea, vomiting, or diarrhea.  Denies dizziness, lightheadedness, pre syncopal or syncopal episodes.   -----------------------------------------------------------------------------------------      Medications: Outpatient Medications Prior to Visit  Medication Sig  . Bromfenac Sodium (BROMSITE) 0.075 % SOLN instill 1 drop in left eye 1 time daily starting 1 day prior to surgery  . [DISCONTINUED] traZODone (DESYREL) 50 MG tablet TAKE 1/2 TO 1 TABLET(25 TO 50 MG) BY MOUTH AT BEDTIME AS NEEDED FOR SLEEP. WILL CAUSE DROWSINESS   No  facility-administered medications prior to visit.    Review of Systems  Constitutional: Negative.   HENT: Negative.   Respiratory: Negative.   Cardiovascular: Negative.   Gastrointestinal: Negative.   Genitourinary: Negative.   Musculoskeletal: Positive for arthralgias and back pain.  Neurological: Positive for numbness. Negative for dizziness, tremors, seizures, syncope, facial asymmetry, speech difficulty, weakness, light-headedness and headaches.  Hematological: Negative.   Psychiatric/Behavioral: Positive for sleep disturbance. Negative for suicidal ideas.    Last CBC Lab Results  Component Value Date   WBC 5.8 07/18/2020   HGB 14.9 07/18/2020   HCT 43.5 07/18/2020   MCV 90 07/18/2020   MCH 31.0 07/18/2020   RDW 11.9 07/18/2020   PLT 242 07/18/2020   Last metabolic panel Lab Results  Component Value Date   GLUCOSE 106 (H) 07/18/2020   NA 141 07/18/2020   K 4.9 07/18/2020   CL 104 07/18/2020   CO2 24 07/18/2020   BUN 15 07/18/2020   CREATININE 0.71 07/18/2020   GFRNONAA 92 07/18/2020   GFRAA 106 07/18/2020   CALCIUM 9.5 07/18/2020   PROT 6.9 07/18/2020   ALBUMIN 4.6 07/18/2020   LABGLOB 2.3 07/18/2020   AGRATIO 2.0 07/18/2020   BILITOT 0.5 07/18/2020   ALKPHOS 83 07/18/2020   AST 29 07/18/2020   ALT 47 (H) 07/18/2020   Last lipids Lab Results  Component Value Date   CHOL 254 (H) 07/18/2020   HDL 56 07/18/2020   LDLCALC 181 (H) 07/18/2020   TRIG 96 07/18/2020  CHOLHDL 5 04/14/2017   Last hemoglobin A1c Lab Results  Component Value Date   HGBA1C 6.1 (H) 07/18/2020   Last thyroid functions Lab Results  Component Value Date   TSH 3.040 07/18/2020   Last vitamin D Lab Results  Component Value Date   VD25OH 23.9 (L) 07/18/2020   Last vitamin B12 and Folate Lab Results  Component Value Date   VITAMINB12 797 07/18/2020       Objective    BP 140/63   Pulse 61   Resp 16   Wt 205 lb (93 kg)   SpO2 94%   BMI 33.09 kg/m  BP Readings from  Last 3 Encounters:  08/15/20 140/63  07/17/20 (!) 137/56  04/14/17 125/78   Wt Readings from Last 3 Encounters:  08/15/20 205 lb (93 kg)  07/17/20 207 lb 6.4 oz (94.1 kg)  04/14/17 200 lb 4 oz (90.8 kg)       Physical Exam Vitals reviewed.  Constitutional:      General: She is not in acute distress.    Appearance: She is well-developed. She is not diaphoretic.     Interventions: She is not intubated. HENT:     Head: Normocephalic and atraumatic.     Right Ear: External ear normal.     Left Ear: External ear normal.     Nose: Nose normal.     Mouth/Throat:     Pharynx: No oropharyngeal exudate.  Eyes:     General: Lids are normal. No scleral icterus.       Right eye: No discharge.        Left eye: No discharge.     Conjunctiva/sclera: Conjunctivae normal.     Right eye: Right conjunctiva is not injected. No exudate or hemorrhage.    Left eye: Left conjunctiva is not injected. No exudate or hemorrhage.    Pupils: Pupils are equal, round, and reactive to light.  Neck:     Thyroid: No thyroid mass or thyromegaly.     Vascular: Normal carotid pulses. No carotid bruit, hepatojugular reflux or JVD.     Trachea: Trachea and phonation normal. No tracheal tenderness or tracheal deviation.     Meningeal: Brudzinski's sign and Kernig's sign absent.  Cardiovascular:     Rate and Rhythm: Normal rate and regular rhythm.     Pulses: Normal pulses.          Radial pulses are 2+ on the right side and 2+ on the left side.       Dorsalis pedis pulses are 2+ on the right side and 2+ on the left side.       Posterior tibial pulses are 2+ on the right side and 2+ on the left side.     Heart sounds: Normal heart sounds, S1 normal and S2 normal. Heart sounds not distant. No murmur heard. No friction rub. No gallop.   Pulmonary:     Effort: Pulmonary effort is normal. No tachypnea, bradypnea, accessory muscle usage or respiratory distress. She is not intubated.     Breath sounds: Normal breath  sounds. No stridor. No wheezing or rales.  Chest:     Chest wall: No tenderness.  Breasts:     Right: No supraclavicular adenopathy.     Left: No supraclavicular adenopathy.    Abdominal:     General: Bowel sounds are normal. There is no distension or abdominal bruit.     Palpations: Abdomen is soft. There is no shifting dullness, fluid wave, hepatomegaly, splenomegaly, mass  or pulsatile mass.     Tenderness: There is no abdominal tenderness. There is no guarding or rebound.     Hernia: No hernia is present.  Musculoskeletal:        General: No tenderness or deformity. Normal range of motion.     Cervical back: Full passive range of motion without pain, normal range of motion and neck supple. No edema, erythema or rigidity. No spinous process tenderness or muscular tenderness. Normal range of motion.  Feet:     Comments: Left foot drop mild.  Lymphadenopathy:     Head:     Right side of head: No submental, submandibular, tonsillar, preauricular, posterior auricular or occipital adenopathy.     Left side of head: No submental, submandibular, tonsillar, preauricular, posterior auricular or occipital adenopathy.     Cervical: No cervical adenopathy.     Right cervical: No superficial, deep or posterior cervical adenopathy.    Left cervical: No superficial, deep or posterior cervical adenopathy.     Upper Body:     Right upper body: No supraclavicular or pectoral adenopathy.     Left upper body: No supraclavicular or pectoral adenopathy.  Skin:    General: Skin is warm and dry.     Coloration: Skin is not pale.     Findings: No abrasion, bruising, burn, ecchymosis, erythema, lesion, petechiae or rash.     Nails: There is no clubbing.  Neurological:     Mental Status: She is alert and oriented to person, place, and time.     GCS: GCS eye subscore is 4. GCS verbal subscore is 5. GCS motor subscore is 6.     Cranial Nerves: No cranial nerve deficit.     Sensory: No sensory deficit.      Motor: No tremor, atrophy, abnormal muscle tone or seizure activity.     Coordination: Coordination normal.     Gait: Gait normal.     Deep Tendon Reflexes: Reflexes are normal and symmetric. Reflexes normal. Babinski sign absent on the right side. Babinski sign absent on the left side.     Reflex Scores:      Tricep reflexes are 2+ on the right side and 2+ on the left side.      Bicep reflexes are 2+ on the right side and 2+ on the left side.      Brachioradialis reflexes are 2+ on the right side and 2+ on the left side.      Patellar reflexes are 2+ on the right side and 2+ on the left side.      Achilles reflexes are 2+ on the right side and 2+ on the left side. Psychiatric:        Speech: Speech normal.        Behavior: Behavior normal.        Thought Content: Thought content normal.        Judgment: Judgment normal.     No results found for any visits on 08/15/20.  Assessment & Plan     Chronic left-sided low back pain with left-sided sciatica - Plan: DG Lumbar Spine Complete  Insomnia, unspecified type - Plan: traZODone (DESYREL) 50 MG tablet, Comprehensive Metabolic Panel (CMET)  Hyperlipidemia, unspecified hyperlipidemia type - Plan: Lipid Panel w/o Chol/HDL Ratio, ezetimibe (ZETIA) 10 MG tablet  Paresthesia of left leg - Plan: gabapentin (NEURONTIN) 100 MG capsule  Prediabetes - Plan: HgB A1c  Vitamin D deficiency - Plan: VITAMIN D 25 Hydroxy (Vit-D Deficiency, Fractures), Vitamin D, Ergocalciferol, (  DRISDOL) 1.25 MG (50000 UNIT) CAPS capsule  Radiculopathy with lower extremity symptoms  Meds ordered this encounter  Medications  . traZODone (DESYREL) 50 MG tablet    Sig: Take 1 tablet (50 mg total) by mouth at bedtime as needed for sleep (will cause drowsiness.).    Dispense:  90 tablet    Refill:  0  . Vitamin D, Ergocalciferol, (DRISDOL) 1.25 MG (50000 UNIT) CAPS capsule    Sig: Take 1 capsule (50,000 Units total) by mouth every 7 (seven) days. (taking one tablet  per week) walk in lab in office 1-2 weeks after completing prescription.    Dispense:  12 capsule    Refill:  0  . ezetimibe (ZETIA) 10 MG tablet    Sig: Take 1 tablet (10 mg total) by mouth daily.    Dispense:  90 tablet    Refill:  3  . gabapentin (NEURONTIN) 100 MG capsule    Sig: Take 1 capsule (100 mg total) by mouth 3 (three) times daily.    Dispense:  180 capsule    Refill:  0   Orders Placed This Encounter  Procedures  . DG Lumbar Spine Complete  . Comprehensive Metabolic Panel (CMET)  . VITAMIN D 25 Hydroxy (Vit-D Deficiency, Fractures)  . Lipid Panel w/o Chol/HDL Ratio  . HgB A1c  labs recheck in 3 months.  Diet and exercise for high cholesterol and pre diabetes to prevent diabetes. Zetia started.   She will continue Trazodone for insomnia and take earlier as she goes to bed at 7pm.   Red Flags discussed. The patient was given clear instructions to go to ER or return to medical center if any red flags develop, symptoms do not improve, worsen or new problems develop. They verbalized understanding.   Return in about 3 months (around 11/15/2020), or if symptoms worsen or fail to improve, for at any time for any worsening symptoms, Go to Emergency room/ urgent care if worse.     The entirety of the information documented in the History of Present Illness, Review of Systems and Physical Exam were personally obtained by me. Portions of this information were initially documented by the CMA and reviewed by me for thoroughness and accuracy.      Jairo Ben, FNP  [facility] [phone] (phone) [phone] (fax)  [facility]

## 2020-08-15 NOTE — Progress Notes (Signed)
IMPRESSION: No mammographic evidence of malignancy. A result letter of this screening mammogram will be mailed directly to the patient.  RECOMMENDATION: Screening mammogram in one year. (Code:SM-B-01Y)  BI-RADS CATEGORY  1: Negative.

## 2020-08-15 NOTE — Patient Instructions (Addendum)
Trazodone Tablets What is this medicine? TRAZODONE (TRAZ oh done) is used to treat depression. Vitamin D Deficiency Vitamin D deficiency is when your body does not have enough vitamin D. Vitamin D is important to your body because:  It helps your body use other minerals.  It helps to keep your bones strong and healthy.  It may help to prevent some diseases.  It helps your heart and other muscles work well. Not getting enough vitamin D can make your bones soft. It can also cause other health problems. What are the causes? This condition may be caused by:  Not eating enough foods that contain vitamin D.  Not getting enough sun.  Having diseases that make it hard for your body to absorb vitamin D.  Having a surgery in which a part of the stomach or a part of the small intestine is removed.  Having kidney disease or liver disease. What increases the risk? You are more likely to get this condition if:  You are older.  You do not spend much time outdoors.  You live in a nursing home.  You have had broken bones.  You have weak or thin bones (osteoporosis).  You have a disease or condition that changes how your body absorbs vitamin D.  You have dark skin.  You take certain medicines.  You are overweight or obese. What are the signs or symptoms?  In mild cases, there may not be any symptoms. If the condition is very bad, symptoms may include: ? Bone pain. ? Muscle pain. ? Falling often. ? Broken bones caused by a minor injury. How is this treated? Treatment may include taking supplements as told by your doctor. Your doctor will tell you what dose is best for you. Supplements may include:  Vitamin D.  Calcium. Follow these instructions at home: Eating and drinking  Eat foods that contain vitamin D, such as: ? Dairy products, cereals, or juices with added vitamin D. Check the label. ? Fish, such as salmon or trout. ? Eggs. ? Oysters. ? Mushrooms. The items  listed above may not be a complete list of what you can eat and drink. Contact a dietitian for more options.   General instructions  Take medicines and supplements only as told by your doctor.  Get regular, safe exposure to natural sunlight.  Do not use a tanning bed.  Maintain a healthy weight. Lose weight if needed.  Keep all follow-up visits as told by your doctor. This is important. How is this prevented?  You can get vitamin D by: ? Eating foods that naturally contain vitamin D. ? Eating or drinking products that have vitamin D added to them, such as cereals, juices, and milk. ? Taking vitamin D or a multivitamin that contains vitamin D. ? Being in the sun. Your body makes vitamin D when your skin is exposed to sunlight. Your body changes the sunlight into a form of the vitamin that it can use. Contact a doctor if:  Your symptoms do not go away.  You feel sick to your stomach (nauseous).  You throw up (vomit).  You poop less often than normal, or you have trouble pooping (constipation). Summary  Vitamin D deficiency is when your body does not have enough vitamin D.  Vitamin D helps to keep your bones strong and healthy.  This condition is often treated by taking a supplement.  Your doctor will tell you what dose is best for you. This information is not intended to replace  advice given to you by your health care provider. Make sure you discuss any questions you have with your health care provider. Document Revised: 01/17/2018 Document Reviewed: 01/17/2018 Elsevier Patient Education  2021 Elsevier Inc.  Insomnia Insomnia is a sleep disorder that makes it difficult to fall asleep or stay asleep. Insomnia can cause fatigue, low energy, difficulty concentrating, mood swings, and poor performance at work or school. There are three different ways to classify insomnia:  Difficulty falling asleep.  Difficulty staying asleep.  Waking up too early in the morning. Any type  of insomnia can be long-term (chronic) or short-term (acute). Both are common. Short-term insomnia usually lasts for three months or less. Chronic insomnia occurs at least three times a week for longer than three months. What are the causes? Insomnia may be caused by another condition, situation, or substance, such as:  Anxiety.  Certain medicines.  Gastroesophageal reflux disease (GERD) or other gastrointestinal conditions.  Asthma or other breathing conditions.  Restless legs syndrome, sleep apnea, or other sleep disorders.  Chronic pain.  Menopause.  Stroke.  Abuse of alcohol, tobacco, or illegal drugs.  Mental health conditions, such as depression.  Caffeine.  Neurological disorders, such as Alzheimer's disease.  An overactive thyroid (hyperthyroidism). Sometimes, the cause of insomnia may not be known. What increases the risk? Risk factors for insomnia include:  Gender. Women are affected more often than men.  Age. Insomnia is more common as you get older.  Stress.  Lack of exercise.  Irregular work schedule or working night shifts.  Traveling between different time zones.  Certain medical and mental health conditions. What are the signs or symptoms? If you have insomnia, the main symptom is having trouble falling asleep or having trouble staying asleep. This may lead to other symptoms, such as:  Feeling fatigued or having low energy.  Feeling nervous about going to sleep.  Not feeling rested in the morning.  Having trouble concentrating.  Feeling irritable, anxious, or depressed. How is this diagnosed? This condition may be diagnosed based on:  Your symptoms and medical history. Your health care provider may ask about: ? Your sleep habits. ? Any medical conditions you have. ? Your mental health.  A physical exam. How is this treated? Treatment for insomnia depends on the cause. Treatment may focus on treating an underlying condition that is  causing insomnia. Treatment may also include:  Medicines to help you sleep.  Counseling or therapy.  Lifestyle adjustments to help you sleep better. Follow these instructions at home: Eating and drinking  Limit or avoid alcohol, caffeinated beverages, and cigarettes, especially close to bedtime. These can disrupt your sleep.  Do not eat a large meal or eat spicy foods right before bedtime. This can lead to digestive discomfort that can make it hard for you to sleep.   Sleep habits  Keep a sleep diary to help you and your health care provider figure out what could be causing your insomnia. Write down: ? When you sleep. ? When you wake up during the night. ? How well you sleep. ? How rested you feel the next day. ? Any side effects of medicines you are taking. ? What you eat and drink.  Make your bedroom a dark, comfortable place where it is easy to fall asleep. ? Put up shades or blackout curtains to block light from outside. ? Use a white noise machine to block noise. ? Keep the temperature cool.  Limit screen use before bedtime. This includes: ? Watching  TV. ? Using your smartphone, tablet, or computer.  Stick to a routine that includes going to bed and waking up at the same times every day and night. This can help you fall asleep faster. Consider making a quiet activity, such as reading, part of your nighttime routine.  Try to avoid taking naps during the day so that you sleep better at night.  Get out of bed if you are still awake after 15 minutes of trying to sleep. Keep the lights down, but try reading or doing a quiet activity. When you feel sleepy, go back to bed.   General instructions  Take over-the-counter and prescription medicines only as told by your health care provider.  Exercise regularly, as told by your health care provider. Avoid exercise starting several hours before bedtime.  Use relaxation techniques to manage stress. Ask your health care provider to  suggest some techniques that may work well for you. These may include: ? Breathing exercises. ? Routines to release muscle tension. ? Visualizing peaceful scenes.  Make sure that you drive carefully. Avoid driving if you feel very sleepy.  Keep all follow-up visits as told by your health care provider. This is important. Contact a health care provider if:  You are tired throughout the day.  You have trouble in your daily routine due to sleepiness.  You continue to have sleep problems, or your sleep problems get worse. Get help right away if:  You have serious thoughts about hurting yourself or someone else. If you ever feel like you may hurt yourself or others, or have thoughts about taking your own life, get help right away. You can go to your nearest emergency department or call:  Your local emergency services (911 in the [place]  A suicide crisis helpline, such as the National Suicide Prevention Lifeline at (319)018-5352. This is open 24 hours a day. Summary  Insomnia is a sleep disorder that makes it difficult to fall asleep or stay asleep.  Insomnia can be long-term (chronic) or short-term (acute).  Treatment for insomnia depends on the cause. Treatment may focus on treating an underlying condition that is causing insomnia.  Keep a sleep diary to help you and your health care provider figure out what could be causing your insomnia. This information is not intended to replace advice given to you by your health care provider. Make sure you discuss any questions you have with your health care provider. Document Revised: 03/21/2020 Document Reviewed: 03/21/2020 Elsevier Patient Education  2021 [employer].  This medicine may be used for other purposes; ask your health care provider or pharmacist if you have questions. COMMON BRAND NAME(S): Desyrel What should I tell my health care provider before I take this medicine? They need to know if you have any of these  conditions:  attempted suicide or thinking about it  bipolar disorder  bleeding problems  glaucoma  heart disease, or previous heart attack  irregular heart beat  kidney or liver disease  low levels of sodium in the blood  an unusual or allergic reaction to trazodone, other medicines, foods, dyes or preservatives  pregnant or trying to get pregnant  breast-feeding How should I use this medicine? Take this medicine by mouth with a glass of water. Follow the directions on the prescription label. Take this medicine shortly after a meal or a light snack. Take your medicine at regular intervals. Do not take your medicine more often than directed. Do not stop taking this medicine suddenly except upon the  advice of your doctor. Stopping this medicine too quickly may cause serious side effects or your condition may worsen. A special MedGuide will be given to you by the pharmacist with each prescription and refill. Be sure to read this information carefully each time. Talk to your pediatrician regarding the use of this medicine in children. Special care may be needed. Overdosage: If you think you have taken too much of this medicine contact a poison control center or emergency room at once. NOTE: This medicine is only for you. Do not share this medicine with others. What if I miss a dose? If you miss a dose, take it as soon as you can. If it is almost time for your next dose, take only that dose. Do not take double or extra doses. What may interact with this medicine? Do not take this medicine with any of the following medications:  certain medicines for fungal infections like fluconazole, itraconazole, ketoconazole, posaconazole, voriconazole  cisapride  dronedarone  linezolid  MAOIs like Carbex, Eldepryl, Marplan, Nardil, and Parnate  mesoridazine  methylene blue (injected into a vein)  pimozide  saquinavir  thioridazine This medicine may also interact with the following  medications:  alcohol  antiviral medicines for HIV or AIDS  aspirin and aspirin-like medicines  barbiturates like phenobarbital  certain medicines for blood pressure, heart disease, irregular heart beat  certain medicines for depression, anxiety, or psychotic disturbances  certain medicines for migraine headache like almotriptan, eletriptan, frovatriptan, naratriptan, rizatriptan, sumatriptan, zolmitriptan  certain medicines for seizures like carbamazepine and phenytoin  certain medicines for sleep  certain medicines that treat or prevent blood clots like dalteparin, enoxaparin, warfarin  digoxin  fentanyl  lithium  NSAIDS, medicines for pain and inflammation, like ibuprofen or naproxen  other medicines that prolong the QT interval (cause an abnormal heart rhythm) like dofetilide  rasagiline  supplements like St. John's wort, kava kava, valerian  tramadol  tryptophan This list may not describe all possible interactions. Give your health care provider a list of all the medicines, herbs, non-prescription drugs, or dietary supplements you use. Also tell them if you smoke, drink alcohol, or use illegal drugs. Some items may interact with your medicine. What should I watch for while using this medicine? Tell your doctor if your symptoms do not get better or if they get worse. Visit your doctor or health care professional for regular checks on your progress. Because it may take several weeks to see the full effects of this medicine, it is important to continue your treatment as prescribed by your doctor. Patients and their families should watch out for new or worsening thoughts of suicide or depression. Also watch out for sudden changes in feelings such as feeling anxious, agitated, panicky, irritable, hostile, aggressive, impulsive, severely restless, overly excited and hyperactive, or not being able to sleep. If this happens, especially at the beginning of treatment or after a  change in dose, call your health care professional. Bonita QuinYou may get drowsy or dizzy. Do not drive, use machinery, or do anything that needs mental alertness until you know how this medicine affects you. Do not stand or sit up quickly, especially if you are an older patient. This reduces the risk of dizzy or fainting spells. Alcohol may interfere with the effect of this medicine. Avoid alcoholic drinks. This medicine may cause dry eyes and blurred vision. If you wear contact lenses you may feel some discomfort. Lubricating drops may help. See your eye doctor if the problem does not go  away or is severe. Your mouth may get dry. Chewing sugarless gum, sucking hard candy and drinking plenty of water may help. Contact your doctor if the problem does not go away or is severe. What side effects may I notice from receiving this medicine? Side effects that you should report to your doctor or health care professional as soon as possible:  allergic reactions like skin rash, itching or hives, swelling of the face, lips, or tongue  elevated mood, decreased need for sleep, racing thoughts, impulsive behavior  confusion  fast, irregular heartbeat  feeling faint or lightheaded, falls  feeling agitated, angry, or irritable  loss of balance or coordination  painful or prolonged erections  restlessness, pacing, inability to keep still  suicidal thoughts or other mood changes  tremors  trouble sleeping  seizures  unusual bleeding or bruising Side effects that usually do not require medical attention (report to your doctor or health care professional if they continue or are bothersome):  change in sex drive or performance  change in appetite or weight  constipation  headache  muscle aches or pains  nausea This list may not describe all possible side effects. Call your doctor for medical advice about side effects. You may report side effects to FDA at 1-800-FDA-1088. Where should I keep my  medicine? Keep out of the reach of children. Store at room temperature between 15 and 30 degrees C (59 to 86 degrees F). Protect from light. Keep container tightly closed. Throw away any unused medicine after the expiration date. NOTE: This sheet is a summary. It may not cover all possible information. If you have questions about this medicine, talk to your doctor, pharmacist, or health care provider.  2021 Elsevier/Gold Standard (2020-04-01 14:46:11) Preventing High Cholesterol Cholesterol is a white, waxy substance similar to fat that the human body needs to help build cells. The liver makes all the cholesterol that a person's body needs. Having high cholesterol (hypercholesterolemia) increases your risk for heart disease and stroke. Extra or excess cholesterol comes from the food that you eat. High cholesterol can often be prevented with diet and lifestyle changes. If you already have high cholesterol, you can control it with diet, lifestyle changes, and medicines. How can high cholesterol affect me? If you have high cholesterol, fatty deposits (plaques) may build up on the walls of your blood vessels. The blood vessels that carry blood away from your heart are called arteries. Plaques make the arteries narrower and stiffer. This in turn can:  Restrict or block blood flow and cause blood clots to form.  Increase your risk for heart attack and stroke. What can increase my risk for high cholesterol? This condition is more likely to develop in people who:  Eat foods that are high in saturated fat or cholesterol. Saturated fat is mostly found in foods that come from animal sources.  Are overweight.  Are not getting enough exercise.  Have a family history of high cholesterol (familial hypercholesterolemia). What actions can I take to prevent this? Nutrition  Eat less saturated fat.  Avoid trans fats (partially hydrogenated oils). These are often found in margarine and in some baked goods,  fried foods, and snacks bought in packages.  Avoid precooked or cured meat, such as bacon, sausages, or meat loaves.  Avoid foods and drinks that have added sugars.  Eat more fruits, vegetables, and whole grains.  Choose healthy sources of protein, such as fish, poultry, lean cuts of red meat, beans, peas, lentils, and nuts.  Choose healthy sources of fat, such as: ? Nuts. ? Vegetable oils, especially olive oil. ? Fish that have healthy fats, such as omega-3 fatty acids. These fish include mackerel or salmon.   Lifestyle  Lose weight if you are overweight. Maintaining a healthy body mass index (BMI) can help prevent or control high cholesterol. It can also lower your risk for diabetes and high blood pressure. Ask your health care provider to help you with a diet and exercise plan to lose weight safely.  Do not use any products that contain nicotine or tobacco, such as cigarettes, e-cigarettes, and chewing tobacco. If you need help quitting, ask your health care provider. Alcohol use  Do not drink alcohol if: ? Your health care provider tells you not to drink. ? You are pregnant, may be pregnant, or are planning to become pregnant.  If you drink alcohol: ? Limit how much you use to:  0-1 drink a day for women.  0-2 drinks a day for men. ? Be aware of how much alcohol is in your drink. In the [place] one drink equals one 12 oz bottle of beer (355 mL), one 5 oz glass of wine (148 mL), or one 1 oz glass of hard liquor (44 mL). Activity  Get enough exercise. Do exercises as told by your health care provider.  Each week, do at least 150 minutes of exercise that takes a medium level of effort (moderate-intensity exercise). This kind of exercise: ? Makes your heart beat faster while allowing you to still be able to talk. ? Can be done in short sessions several times a day or longer sessions a few times a week. For example, on 5 days each week, you could walk fast or ride your bike 3  times a day for 10 minutes each time.   Medicines  Your health care provider may recommend medicines to help lower cholesterol. This may be a medicine to lower the amount of cholesterol that your liver makes. You may need medicine if: ? Diet and lifestyle changes have not lowered your cholesterol enough. ? You have high cholesterol and other risk factors for heart disease or stroke.  Take over-the-counter and prescription medicines only as told by your health care provider. General information  Manage your risk factors for high cholesterol. Talk with your health care provider about all your risk factors and how to lower your risk.  Manage other conditions that you have, such as diabetes or high blood pressure (hypertension).  Have blood tests to check your cholesterol levels at regular points in time as told by your health care provider.  Keep all follow-up visits as told by your health care provider. This is important. Where to find more information  American Heart Association: www.heart.org  National Heart, Lung, and Blood Institute: [employer].is Summary  High cholesterol increases your risk for heart disease and stroke. By keeping your cholesterol level low, you can reduce your risk for these conditions.  High cholesterol can often be prevented with diet and lifestyle changes.  Work with your health care provider to manage your risk factors, and have your blood tested regularly. This information is not intended to replace advice given to you by your health care provider. Make sure you discuss any questions you have with your health care provider. Document Revised: 02/21/2019 Document Reviewed: 02/21/2019 Elsevier Patient Education  2021 Elsevier Inc. Ezetimibe Tablets What is this medicine? EZETIMIBE (ez ET i mibe) treats high cholesterol. Ezetimibe blocks the absorption of cholesterol from the  stomach. It is used with lifestyle changes, like diet and exercise. It may be  used alone or with other medicines. This medicine may be used for other purposes; ask your health care provider or pharmacist if you have questions. COMMON BRAND NAME(S): Zetia What should I tell my health care provider before I take this medicine? They need to know if you have any of these conditions:  kidney disease  liver disease  muscle cramps, pain  muscle injury  thyroid disease  an unusual or allergic reaction to ezetimibe, other medicines, foods, dyes, or preservatives  pregnant or trying to get pregnant  breast-feeding How should I use this medicine? Take this medicine by mouth. Take it as directed on the prescription label at the same time every day. You can take it with or without food. If it upsets your stomach, take it with food. Keep taking it unless your health care provider tells you to stop. Take bile acid sequestrants at a different time of day than this medicine. Take this medicine 2 hours BEFORE or 4 hours AFTER bile acid sequestrants. Talk to your health care provider about the use of this medicine in children. While it may be prescribed for children as young as 10 for selected conditions, precautions do apply. Overdosage: If you think you have taken too much of this medicine contact a poison control center or emergency room at once. NOTE: This medicine is only for you. Do not share this medicine with others. What if I miss a dose? If you miss a dose, take it as soon as you can. If it is almost time for your next dose, take only that dose. Do not take double or extra doses. What may interact with this medicine? Do not take this medicine with any of the following medications:  fenofibrate  gemfibrozil This medicine may also interact with the following medications:  antacids  cyclosporine  herbal medicines like red yeast rice  other medicines to lower cholesterol or triglycerides This list may not describe all possible interactions. Give your health care  provider a list of all the medicines, herbs, non-prescription drugs, or dietary supplements you use. Also tell them if you smoke, drink alcohol, or use illegal drugs. Some items may interact with your medicine. What should I watch for while using this medicine? Visit your health care provider for regular checks on your progress. Tell your health care provider if your symptoms do not start to get better or if they get worse. Your health care provider may tell you to stop taking this medicine if you develop muscle problems. If your muscle problems do not go away after stopping this medicine, contact your health care provider. Do not become pregnant while taking this medicine. Women should inform their health care provider if they wish to become pregnant or think they might be pregnant. There is potential for serious harm to an unborn child. Talk to your health care provider for more information. Do not breast-feed an infant while taking this medicine. Taking this medicine is only part of a total heart healthy program. Your health care provider may give you a special diet to follow. Avoid alcohol. Avoid smoking. Ask your health care provider how much you should exercise. What side effects may I notice from receiving this medicine? Side effects that you should report to your doctor or health care provider as soon as possible:  allergic reactions (skin rash, itching or hives; swelling of the face, lips, or tongue)  liver injury (dark  yellow or brown urine; general ill feeling or flu-like symptoms; loss of appetite, right upper belly pain; unusually weak or tired, yellowing of the eyes or skin)  muscle injury (dark urine; trouble passing urine or change in the amount of urine; unusually weak or tired; muscle pain; back pain) Side effects that usually do not require medical attention (report to your doctor or health care provider if they continue or are bothersome):  depressed  mood  diarrhea  headache  infection (fever, chills, cough, sore throat, pain, or trouble passing urine)  joint pain  stomach pain This list may not describe all possible side effects. Call your doctor for medical advice about side effects. You may report side effects to FDA at 1-800-FDA-1088. Where should I keep my medicine? Keep out of the reach of children and pets. Store at room temperature between 15 and 30 degrees C (59 and 86 degrees F). Protect from moisture. Get rid of any unused medicine after the expiration date. NOTE: This sheet is a summary. It may not cover all possible information. If you have questions about this medicine, talk to your doctor, pharmacist, or health care provider.  2021 Elsevier/Gold Standard (2019-04-19 17:28:51)  Mediterranean Diet A Mediterranean diet refers to food and lifestyle choices that are based on the traditions of countries located on the [employer]. This way of eating has been shown to help prevent certain conditions and improve outcomes for people who have chronic diseases, like kidney disease and heart disease. What are tips for following this plan? Lifestyle  Cook and eat meals together with your family, when possible.  Drink enough fluid to keep your urine clear or pale yellow.  Be physically active every day. This includes: ? Aerobic exercise like running or swimming. ? Leisure activities like gardening, walking, or housework.  Get 7-8 hours of sleep each night.  If recommended by your health care provider, drink red wine in moderation. This means 1 glass a day for nonpregnant women and 2 glasses a day for men. A glass of wine equals 5 oz (150 mL). Reading food labels  Check the serving size of packaged foods. For foods such as rice and pasta, the serving size refers to the amount of cooked product, not dry.  Check the total fat in packaged foods. Avoid foods that have saturated fat or trans fats.  Check the ingredients  list for added sugars, such as corn syrup.   Shopping  At the grocery store, buy most of your food from the areas near the walls of the store. This includes: ? Fresh fruits and vegetables (produce). ? Grains, beans, nuts, and seeds. Some of these may be available in unpackaged forms or large amounts (in bulk). ? Fresh seafood. ? Poultry and eggs. ? Low-fat dairy products.  Buy whole ingredients instead of prepackaged foods.  Buy fresh fruits and vegetables in-season from local farmers markets.  Buy frozen fruits and vegetables in resealable bags.  If you do not have access to quality fresh seafood, buy precooked frozen shrimp or canned fish, such as tuna, salmon, or sardines.  Buy small amounts of raw or cooked vegetables, salads, or olives from the deli or salad bar at your store.  Stock your pantry so you always have certain foods on hand, such as olive oil, canned tuna, canned tomatoes, rice, pasta, and beans. Cooking  Cook foods with extra-virgin olive oil instead of using butter or other vegetable oils.  Have meat as a side dish, and have vegetables  or grains as your main dish. This means having meat in small portions or adding small amounts of meat to foods like pasta or stew.  Use beans or vegetables instead of meat in common dishes like chili or lasagna.  Experiment with different cooking methods. Try roasting or broiling vegetables instead of steaming or sauteing them.  Add frozen vegetables to soups, stews, pasta, or rice.  Add nuts or seeds for added healthy fat at each meal. You can add these to yogurt, salads, or vegetable dishes.  Marinate fish or vegetables using olive oil, lemon juice, garlic, and fresh herbs. Meal planning  Plan to eat 1 vegetarian meal one day each week. Try to work up to 2 vegetarian meals, if possible.  Eat seafood 2 or more times a week.  Have healthy snacks readily available, such as: ? Vegetable sticks with hummus. ? [place]  yogurt. ? Fruit and nut trail mix.  Eat balanced meals throughout the week. This includes: ? Fruit: 2-3 servings a day ? Vegetables: 4-5 servings a day ? Low-fat dairy: 2 servings a day ? Fish, poultry, or lean meat: 1 serving a day ? Beans and legumes: 2 or more servings a week ? Nuts and seeds: 1-2 servings a day ? Whole grains: 6-8 servings a day ? Extra-virgin olive oil: 3-4 servings a day  Limit red meat and sweets to only a few servings a month   What are my food choices?  Mediterranean diet ? Recommended  Grains: Whole-grain pasta. Brown rice. Bulgar wheat. Polenta. Couscous. Whole-wheat bread. Orpah Cobb.  Vegetables: Artichokes. Beets. Broccoli. Cabbage. Carrots. Eggplant. Green beans. Chard. Kale. Spinach. Onions. Leeks. Peas. Squash. Tomatoes. Peppers. Radishes.  Fruits: Apples. Apricots. Avocado. Berries. Bananas. Cherries. Dates. Figs. Grapes. Lemons. Melon. Oranges. Peaches. Plums. Pomegranate.  Meats and other protein foods: Beans. Almonds. Sunflower seeds. Pine nuts. Peanuts. Cod. Salmon. Scallops. Shrimp. Tuna. Tilapia. Clams. Oysters. Eggs.  Dairy: Low-fat milk. Cheese. Greek yogurt.  Beverages: Water. Red wine. Herbal tea.  Fats and oils: Extra virgin olive oil. Avocado oil. Grape seed oil.  Sweets and desserts: [place] yogurt with honey. Baked apples. Poached pears. Trail mix.  Seasoning and other foods: Basil. Cilantro. Coriander. Cumin. Mint. Parsley. Sage. Rosemary. Tarragon. Garlic. Oregano. Thyme. Pepper. Balsalmic vinegar. Tahini. Hummus. Tomato sauce. Olives. Mushrooms. ? Limit these  Grains: Prepackaged pasta or rice dishes. Prepackaged cereal with added sugar.  Vegetables: Deep fried potatoes (french fries).  Fruits: Fruit canned in syrup.  Meats and other protein foods: Beef. Pork. Lamb. Poultry with skin. Hot dogs. Tomasa Blase.  Dairy: Ice cream. Sour cream. Whole milk.  Beverages: Juice. Sugar-sweetened soft drinks. Beer. Liquor and  spirits.  Fats and oils: Butter. Canola oil. Vegetable oil. Beef fat (tallow). Lard.  Sweets and desserts: Cookies. Cakes. Pies. Candy.  Seasoning and other foods: Mayonnaise. Premade sauces and marinades. The items listed may not be a complete list. Talk with your dietitian about what dietary choices are right for you. Summary  The Mediterranean diet includes both food and lifestyle choices.  Eat a variety of fresh fruits and vegetables, beans, nuts, seeds, and whole grains.  Limit the amount of red meat and sweets that you eat.  Talk with your health care provider about whether it is safe for you to drink red wine in moderation. This means 1 glass a day for nonpregnant women and 2 glasses a day for men. A glass of wine equals 5 oz (150 mL). This information is not intended to replace advice given  to you by your health care provider. Make sure you discuss any questions you have with your health care provider. Document Revised: 01/09/2016 Document Reviewed: 01/02/2016 Elsevier Patient Education  [place].

## 2020-08-19 ENCOUNTER — Ambulatory Visit: Payer: Medicare Other | Admitting: Neurology

## 2020-08-19 ENCOUNTER — Encounter: Payer: Self-pay | Admitting: Neurology

## 2020-08-19 VITALS — BP 134/69 | HR 61 | Ht 67.0 in | Wt 204.0 lb

## 2020-08-19 DIAGNOSIS — R0683 Snoring: Secondary | ICD-10-CM

## 2020-08-19 DIAGNOSIS — E669 Obesity, unspecified: Secondary | ICD-10-CM

## 2020-08-19 DIAGNOSIS — R0689 Other abnormalities of breathing: Secondary | ICD-10-CM

## 2020-08-19 DIAGNOSIS — G4700 Insomnia, unspecified: Secondary | ICD-10-CM | POA: Diagnosis not present

## 2020-08-19 DIAGNOSIS — R519 Headache, unspecified: Secondary | ICD-10-CM

## 2020-08-19 DIAGNOSIS — R351 Nocturia: Secondary | ICD-10-CM

## 2020-08-19 NOTE — Progress Notes (Signed)
Subjective:    Patient ID: Suzanne [name] is a 65 y.o. female.  HPI     Huston Foley, MD, PhD [facility] [address] [address] [place], [place] [address]  Dear Marcelino Duster,   I saw your patient, Suzanne [name], upon your kind request in my sleep clinic today for initial consultation of her sleep disorder, in particular, concern for underlying obstructive sleep apnea.  The patient is unaccompanied today.  As you know, Suzanne [name] is a 65 year old right-handed woman with an underlying medical history of arthritis, status post right total knee replacement, back pain, status post back surgery, cataracts, hyperlipidemia, history of ulcer, and obesity, who reports chronic difficulty initiating and maintaining sleep, having woken up with a sense of gasping for air, occasional snoring and some daytime tiredness.  She has recently been started on trazodone and is currently taking 50 mg at bedtime.  She actually has tried taking it around dinnertime as she did not notice it helped when she took it at bedtime.  She has noticed some modest improvement with her chronic sleep difficulty which has been ongoing for the past several years.  She has never had a sleep study.  She has occasional snoring.  She has no family history of sleep apnea.  Her weight has been fluctuating.  Her Epworth sleepiness score is 0 out of 24, fatigue severity score is 32 out of 63.  I reviewed your office note from 07/18/2020.  She lives with her husband.  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  She is on disability.  She reports waking up with a sense of gasping for air and occasional restlessness in her legs but also leg pain particularly on the left side.  She has been on gabapentin.  She has an appointment pending with [facility] for her low back pain radiating to the left leg.  She reports a history of low back pain and surgery 7 years ago, in or around 2007 or 2008 and she has had chronic right  foot drop.  She has nocturia about once per average night and has woken up occasionally with a headache.  She takes caffeine in the form of coffee, 2 to 3 cups in the morning, no alcohol on a regular basis, she is a non-smoker.  Bedtime is generally around 7 PM and rise time typically between 4:30 AM and 6 AM.  Her Past Medical History Is Significant For: Past Medical History:  Diagnosis Date  . Arthritis   . Cataract   . Chicken pox   . Hyperlipidemia   . Ulcer     Her Past Surgical History Is Significant For: Past Surgical History:  Procedure Laterality Date  . EYE SURGERY    . FRACTURE SURGERY    . JOINT REPLACEMENT     TKR Right, 2014  . SPINE SURGERY      Her Family History Is Significant For: Family History  Problem Relation Age of Onset  . Arthritis Mother   . Hyperlipidemia Mother   . Hypertension Mother   . Alcohol abuse Father   . Arthritis Father   . Hyperlipidemia Father   . Hypertension Father   . Diabetes Father   . Breast cancer Paternal Aunt        DOSENT KNOW AGE  . Breast cancer Cousin 40    Her Social History Is Significant For: Social History   Socioeconomic History  . Marital status: Married    Spouse  name: Not on file  . Number of children: Not on file  . Years of education: Not on file  . Highest education level: Not on file  Occupational History  . Not on file  Tobacco Use  . Smoking status: Never Smoker  . Smokeless tobacco: Never Used  Substance and Sexual Activity  . Alcohol use: Yes    Comment: seldom  . Drug use: No  . Sexual activity: Not on file  Other Topics Concern  . Not on file  Social History Narrative  . Not on file   Social Determinants of Health   Financial Resource Strain: Not on file  Food Insecurity: Not on file  Transportation Needs: Not on file  Physical Activity: Not on file  Stress: Not on file  Social Connections: Not on file    Her Allergies Are:  Allergies  Allergen Reactions  . Aspirin      Other reaction(s): Upset stomach  :   Her Current Medications Are:  Outpatient Encounter Medications as of 08/19/2020  Medication Sig  . ezetimibe (ZETIA) 10 MG tablet Take 1 tablet (10 mg total) by mouth daily.  Marland Kitchen gabapentin (NEURONTIN) 100 MG capsule Take 1 capsule (100 mg total) by mouth 3 (three) times daily.  . traZODone (DESYREL) 50 MG tablet Take 1 tablet (50 mg total) by mouth at bedtime as needed for sleep (will cause drowsiness.).  Marland Kitchen Vitamin D, Ergocalciferol, (DRISDOL) 1.25 MG (50000 UNIT) CAPS capsule Take 1 capsule (50,000 Units total) by mouth every 7 (seven) days. (taking one tablet per week) walk in lab in office 1-2 weeks after completing prescription.  . [DISCONTINUED] Bromfenac Sodium (BROMSITE) 0.075 % SOLN instill 1 drop in left eye 1 time daily starting 1 day prior to surgery   No facility-administered encounter medications on file as of 08/19/2020.  :   Review of Systems:  Out of a complete 14 point review of systems, all are reviewed and negative with the exception of these symptoms as listed below:  Review of Systems  Neurological:       Here for sleep consult. No prior sleep study, reports she may snore at night.  Epworth Sleepiness Scale 0= would never doze 1= slight chance of dozing 2= moderate chance of dozing 3= high chance of dozing  Sitting and reading:0 Watching TV:0 Sitting inactive in a public place (ex. Theater or meeting):0 As a passenger in a car for an hour without a break:0 Lying down to rest in the afternoon:0 Sitting and talking to someone:0 Sitting quietly after lunch (no alcohol):0 In a car, while stopped in traffic:0 Total:0     Objective:  Neurological Exam  Physical Exam Physical Examination:   Vitals:   08/19/20 1444  BP: 134/69  Pulse: 61    General Examination: The patient is a very pleasant 65 y.o. female in no acute distress. She appears well-developed and well-nourished and well groomed.   HEENT: Normocephalic,  atraumatic, pupils are equal, round and reactive to light, extraocular tracking is good without limitation to gaze excursion or nystagmus noted. Hearing is grossly intact. Face is symmetric with normal facial animation. Speech is clear with no dysarthria noted. There is no hypophonia. There is no lip, neck/head, jaw or voice tremor. Neck is supple with full range of passive and active motion. There are no carotid bruits on auscultation. Oropharynx exam reveals: mild mouth dryness, adequate dental hygiene with full dentures, and moderate airway crowding, due to smaller airway entry, thicker tongue, tonsillar size of  about 1+, tonsils not fully visualized, Mallampati class IV.  Neck circumference of 16 inches.  Tongue protrudes centrally and palate elevates symmetrically.  Chest: Clear to auscultation without wheezing, rhonchi or crackles noted.  Heart: S1+S2+0, regular and normal without murmurs, rubs or gallops noted.   Abdomen: Soft, non-tender and non-distended.  Extremities: There is no obvious swelling noted around her ankles bilaterally.   Skin: Warm and dry without trophic changes noted.   Musculoskeletal: exam reveals no obvious joint pain but she reports radiating low back pain to the left.   Neurologically:  Mental status: The patient is awake, alert and oriented in all 4 spheres. Her immediate and remote memory, attention, language skills and fund of knowledge are appropriate. There is no evidence of aphasia, agnosia, apraxia or anomia. Speech is clear with normal prosody and enunciation. Thought process is linear. Mood is normal and affect is normal.  Cranial nerves II - XII are as described above under HEENT exam.  Motor exam: Normal bulk noted, moves all 4 extremities.   Cerebellar testing: No dysmetria or intention tremor. There is no truncal or gait ataxia.  Sensory exam: intact to light touch in the upper and lower extremities.  Gait, station and balance: She stands without  obvious difficulty, posture is age-appropriate.  She walks with a slight limp on the right, no walking aid.   Assessment and Plan:  In summary, Suzanne [name] is a very pleasant 64 y.o.-year old female ith an underlying medical history of arthritis, status post right total knee replacement, back pain, status post back surgery, cataracts, hyperlipidemia, history of ulcer, and obesity, whose history and physical exam are concerning for obstructive sleep apnea (OSA). I had a long chat with the patient about my findings and the diagnosis of OSA, its prognosis and treatment options. We talked about medical treatments, surgical interventions and non-pharmacological approaches. I explained in particular the risks and ramifications of untreated moderate to severe OSA, especially with respect to developing cardiovascular disease down the Road, including congestive heart failure, difficult to treat hypertension, cardiac arrhythmias, or stroke. Even type 2 diabetes has, in part, been linked to untreated OSA. Symptoms of untreated OSA include daytime sleepiness, memory problems, mood irritability and mood disorder such as depression and anxiety, lack of energy, as well as recurrent headaches, especially morning headaches. We talked about trying to maintain a healthy lifestyle in general, as well as the importance of weight control. I recommended the following at this time: sleep study.  I explained the sleep test procedure to the patient and also outlined possible surgical and non-surgical treatment options of OSA, including the use of a custom-made dental device (which would require a referral to a specialist dentist or oral surgeon), upper airway surgical options, such as traditional UPPP or a novel less invasive surgical option in the form of Inspire hypoglossal nerve stimulation (which would involve a referral to an ENT surgeon). I also explained the CPAP treatment option to the patient, who indicated that she  would be willing to try CPAP if the need arises.  We will plan a follow-up after sleep testing.  I answered all her questions today and she was in agreement. Thank you very much for allowing me to participate in the care of this nice patient. If I can be of any further assistance to you please do not hesitate to call me at [phone].  Sincerely,   Huston Foley, MD, PhD

## 2020-08-19 NOTE — Patient Instructions (Signed)

## 2020-08-29 ENCOUNTER — Telehealth: Payer: Self-pay
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
## 2020-08-30 NOTE — Telephone Encounter (Signed)
Noted follow up visit IF NEEDED OK TO REFER TO NEUROLOGY.

## 2020-09-03 ENCOUNTER — Other Ambulatory Visit: Payer: Self-pay

## 2020-09-03 ENCOUNTER — Ambulatory Visit (INDEPENDENT_AMBULATORY_CARE_PROVIDER_SITE_OTHER): Payer: Medicare Other | Admitting: Neurology

## 2020-09-03 DIAGNOSIS — R0683 Snoring: Secondary | ICD-10-CM

## 2020-09-03 DIAGNOSIS — R519 Headache, unspecified: Secondary | ICD-10-CM

## 2020-09-03 DIAGNOSIS — E669 Obesity, unspecified: Secondary | ICD-10-CM

## 2020-09-03 DIAGNOSIS — R351 Nocturia: Secondary | ICD-10-CM

## 2020-09-03 DIAGNOSIS — G4720 Circadian rhythm sleep disorder, unspecified type: Secondary | ICD-10-CM

## 2020-09-03 DIAGNOSIS — R0689 Other abnormalities of breathing: Secondary | ICD-10-CM

## 2020-09-03 DIAGNOSIS — G4700 Insomnia, unspecified: Secondary | ICD-10-CM

## 2020-09-16 NOTE — Procedures (Signed)
PATIENT'S NAME:  Suzanne [name], [name] DOB:      [birth year]      MR#:    [id number]     DATE OF RECORDING: 09/03/2020 REFERRING M.D.:  Marvell Fuller, FNP Study Performed:   Baseline Polysomnogram HISTORY: 65 year old woman with a history of arthritis, status post right total knee replacement, back pain, status post back surgery, cataracts, hyperlipidemia, history of ulcer, and obesity, who reports chronic difficulty initiating and maintaining sleep, waking up with a sense of gasping for air, occasional snoring and some daytime tiredness. The patient endorsed the Epworth Sleepiness Scale at 0 points. The patient's weight 204 pounds with a height of 67 (inches), resulting in a BMI of 32.2 kg/m2. The patient's neck circumference measured 16 inches.  CURRENT MEDICATIONS: Vitamin D, Zetia, Neurontin, Trazodone   PROCEDURE:  This is a multichannel digital polysomnogram utilizing the Somnostar 11.2 system.  Electrodes and sensors were applied and monitored per AASM Specifications.   EEG, EOG, Chin and Limb EMG, were sampled at 200 Hz.  ECG, Snore and Nasal Pressure, Thermal Airflow, Respiratory Effort, CPAP Flow and Pressure, Oximetry was sampled at 50 Hz. Digital video and audio were recorded.      BASELINE STUDY  Lights Out was at 21:34 and Lights On at 04:31.  Total recording time (TRT) was 417 minutes, with a total sleep time (TST) of 346 minutes.   The patient's sleep latency was 40 minutes, which is delayed. REM latency was 146.5 minutes, which is delayed. The sleep efficiency was 83%.     SLEEP ARCHITECTURE: WASO (Wake after sleep onset) was 41 minutes with minimal to mild sleep fragmentation noted. There were 11.5 minutes in Stage N1, 236 minutes Stage N2, 26.5 minutes Stage N3 and 72 minutes in Stage REM. The percentage of Stage N1 was 3.3%, Stage N2 was 68.2%, which is increased, Stage N3 was 7.7% and Stage R (REM sleep) was 20.8%, which is normal. The arousals were noted as: 40 were  spontaneous, 0 were associated with PLMs, 0 were associated with respiratory events.  RESPIRATORY ANALYSIS:  There were a total of 0 respiratory events:  0 obstructive apneas, 0 central apneas and 0 mixed apneas with a total of 0 apneas and an apnea index (AI) of 0 /hour. There were 0 hypopneas with a hypopnea index of 0 /hour. The patient also had 0 respiratory event related arousals (RERAs).      The total APNEA/HYPOPNEA INDEX (AHI) was 0/hour and the total RESPIRATORY DISTURBANCE INDEX was  0 /hour.  0 events occurred in REM sleep and 0 events in NREM. The REM AHI was 0/hour, versus a non-REM AHI of 0. The patient spent 99.5 minutes of total sleep time in the supine position and 247 minutes in non-supine.. The supine AHI was 0.0 versus a non-supine AHI of 0.0.  OXYGEN SATURATION & C02:  The Wake baseline 02 saturation was 86%, with the lowest being 90%. Time spent below 89% saturation equaled 0 minutes.  PERIODIC LIMB MOVEMENTS: The patient had a total of 0 Periodic Limb Movements.  The Periodic Limb Movement (PLM) index was 0 and the PLM Arousal index was 0/hour.  Audio and video analysis did not show any abnormal or unusual movements, behaviors, phonations or vocalizations. The patient took bathroom breaks. Intermittent mild snoring was noted. The EKG was in keeping with normal sinus rhythm (NSR).  Post-study, the patient indicated that sleep was better than usual.   IMPRESSION:  1. Primary snoring 2. Dysfunctions associated with sleep stages  or arousal from sleep  RECOMMENDATIONS:  1. This study does not demonstrate any significant obstructive or central sleep disordered breathing with the exception of mild, intermittent snoring. Weight loss along with avoidance of the supine sleep position may reduce her snoring; treatment with positive airway pressure is not indicated. This study does not support an intrinsic sleep disorder as a cause of the patient's symptoms. Other causes, including  circadian rhythm disturbances, an underlying mood disorder, medication effect and/or an underlying medical problem cannot be ruled out. 2. This study shows some sleep fragmentation and mildly abnormal sleep stage percentages; these are nonspecific findings and per se do not signify an intrinsic sleep disorder or a cause for the patient's sleep-related symptoms. Causes include (but are not limited to) the first night effect of the sleep study, circadian rhythm disturbances, medication effect or an underlying mood disorder or medical problem.  3. The patient should be cautioned not to drive, work at heights, or operate dangerous or heavy equipment when tired or sleepy. Review and reiteration of good sleep hygiene measures should be pursued with any patient. 4. The patient will be advised to follow up with the referring provider, who will be notified of the test results.  I certify that I have reviewed the entire raw data recording prior to the issuance of this report in accordance with the Standards of Accreditation of the American Academy of Sleep Medicine (AASM)  Huston Foley, MD, PhD Diplomat, American Board of Neurology and Sleep Medicine (Neurology and Sleep Medicine)

## 2020-09-16 NOTE — Progress Notes (Addendum)
Patient referred by Marvell Fuller, NP, seen by me on 08/19/20, diagnostic PSG on 09/03/20.   Please call and notify the patient that the recent sleep study did not show any significant obstructive sleep apnea with the exception of mild, intermittent snoring. Weight loss along with avoidance of the supine sleep position may reduce her snoring; treatment with positive airway pressure is not indicated. She achieved all stages of sleep. She can FU with her PCP at this point.   Thanks,  Huston Foley, MD, PhD [facility])  Addendum: Average O2 saturation was 95% (not 86%, as indicated in the tech report).

## 2020-09-17 ENCOUNTER — Telehealth: Payer: Self-pay

## 2020-09-17 NOTE — Telephone Encounter (Signed)
I called pt and we discussed results of the sleep study. Pt was very upset that I was the one to call and give her the sleep study results and not the Doctor. She states she was not sent to check for OSA alone but to evaluate if her oxygen saturations were dropping in the middle of the night. I did review with the patient specifically in the sleep study the part about oxygen  OXYGEN SATURATION & C02:  The Wake baseline 02 saturation was 86%, with the lowest being 90%. Time spent below 89% saturation equaled 0 minutes.  Pt stated she read over the report through my chart and knew what the results were.   She reports she feels like her issue was not addressed. Pt reported again she would like to discuss with the Doctor over the phone about the results. I did offer the pt another appointment but she sts she did not need to make another appointment to have her questions addressed.   Pt requested that I pass along this message to the Dr and to ask for Dr. Frances Furbish to call her, # is [phone]. I advised I could pass this message along for her. Pt sts she does not want to be messaged via my chart in regards to this message.

## 2020-09-17 NOTE — Telephone Encounter (Signed)
Noted  

## 2020-09-17 NOTE — Telephone Encounter (Signed)
-----   Message from Huston Foley, MD sent at 09/16/2020  6:07 PM EDT ----- Patient referred by Marvell Fuller, NP, seen by me on 08/19/20, diagnostic PSG on 09/03/20.   Please call and notify the patient that the recent sleep study did not show any significant obstructive sleep apnea with the exception of mild, intermittent snoring. Weight loss along with avoidance of the supine sleep position may reduce her snoring; treatment with positive airway pressure is not indicated. She achieved all stages of sleep. She can FU with her PCP at this point.   Thanks,  Huston Foley, MD, PhD [facility])

## 2020-09-17 NOTE — Telephone Encounter (Signed)
I called and was able to speak to the patient, we had an extended discussion.  I went over her sleep study results and reassured her that her oxygen level did look okay.  I apologized for the typo in the technical report which reports that her average oxygen saturation was 86%, it was actually 95% and I made an addendum in the result note. But, by the time I had signed her sleep study report, I could not go back and make an addendum to the actual report.  If possible, please place a ticket to Epic so we can remove the 86% average saturation and change it to 95%, nadir was 90% and time below 89% saturation was indeed 0 minutes.  She was largely reassured.  She was wondering if a esophageal stretching procedure might help.  She is advised to follow-up with her primary care physician regarding this, esophageal stretching is usually done through GI for chronic reflux issues and esophageal constriction, which is often the result of acid reflux affecting the esophagus.  She is encouraged to follow-up with her primary care nurse practitioner regarding this.  She can also follow-up with her primary care routinely regarding her chronic sleep difficulties.  She does report that she sleeps reasonably well with the trazodone.  FYI, nothing further needed.

## 2020-10-11 ENCOUNTER — Other Ambulatory Visit: Payer: Self-pay | Admitting: Adult Health

## 2020-10-11 DIAGNOSIS — R202 Paresthesia of skin: Secondary | ICD-10-CM

## 2020-11-05 ENCOUNTER — Other Ambulatory Visit: Payer: Self-pay

## 2020-11-05 DIAGNOSIS — E559 Vitamin D deficiency, unspecified: Secondary | ICD-10-CM

## 2020-11-05 MED ORDER — VITAMIN D (ERGOCALCIFEROL) 1.25 MG (50000 UNIT) PO CAPS: 50000.0000 [IU] | ORAL_CAPSULE | ORAL | 0 refills | Status: DC

## 2020-11-05 NOTE — Telephone Encounter (Signed)
Pt requesting refill for Vitamin D. Vitamin D last checked on 07/18/20. Provider suggested recheck labs in 6 months around 8/24.

## 2020-11-11 ENCOUNTER — Telehealth: Payer: Medicare Other | Admitting: Adult Health

## 2020-11-13 ENCOUNTER — Telehealth: Payer: Medicare Other | Admitting: Adult Health

## 2020-11-20 ENCOUNTER — Telehealth: Payer: Self-pay

## 2020-11-20 ENCOUNTER — Other Ambulatory Visit: Payer: Self-pay

## 2020-11-20 DIAGNOSIS — G4700 Insomnia, unspecified: Secondary | ICD-10-CM

## 2020-11-20 NOTE — Telephone Encounter (Signed)
Called pt to schedule follow up appt with Suzanne [name] so she can continue to get refills of her medication.LMTCB

## 2020-11-29 MED ORDER — TRAZODONE HCL 50 MG PO TABS: 50.0000 mg | ORAL_TABLET | Freq: Every evening | ORAL | 0 refills | Status: DC | PRN

## 2020-12-16 ENCOUNTER — Other Ambulatory Visit: Payer: Self-pay | Admitting: Adult Health

## 2020-12-16 DIAGNOSIS — R202 Paresthesia of skin: Secondary | ICD-10-CM

## 2020-12-16 NOTE — Telephone Encounter (Signed)
[facility] faxed refill request for the following medications:  gabapentin (NEURONTIN) 100 MG capsule    Please advise. 

## 2020-12-17 MED ORDER — GABAPENTIN 100 MG PO CAPS: ORAL_CAPSULE | ORAL | 0 refills | Status: DC

## 2021-02-12 ENCOUNTER — Telehealth: Payer: Self-pay | Admitting: Adult Health

## 2021-02-12 NOTE — Telephone Encounter (Signed)
Left message for patient to return call back.  

## 2021-02-12 NOTE — Telephone Encounter (Signed)
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 States that this has flared up again and is very painful. She states that [place] normally gives her a muscle relaxer to help. She could not remember the name of this medication.   Please advise

## 2021-04-23 ENCOUNTER — Telehealth: Payer: Self-pay | Admitting: Adult Health
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
## 2021-04-23 NOTE — Telephone Encounter (Signed)
Patient called complaining of headaches going on for a few weeks. Patient states she needs to see [facility], it is urgent. She feels she needs a MRI. No available appointments today or the remaining of the week. Patient was transferred to Access Nurse.

## 2021-04-29 ENCOUNTER — Telehealth: Payer: Self-pay | Admitting: Adult Health

## 2021-04-29 ENCOUNTER — Encounter: Payer: Self-pay | Admitting: Adult Health

## 2021-04-29 ENCOUNTER — Other Ambulatory Visit: Payer: Self-pay

## 2021-04-29 ENCOUNTER — Ambulatory Visit (INDEPENDENT_AMBULATORY_CARE_PROVIDER_SITE_OTHER): Payer: Medicare Other | Admitting: Adult Health

## 2021-04-29 VITALS — BP 138/66 | HR 65 | Temp 95.8°F | Ht 67.0 in | Wt 198.0 lb

## 2021-04-29 DIAGNOSIS — R519 Headache, unspecified: Secondary | ICD-10-CM | POA: Diagnosis not present

## 2021-04-29 DIAGNOSIS — R9389 Abnormal findings on diagnostic imaging of other specified body structures: Secondary | ICD-10-CM | POA: Diagnosis not present

## 2021-04-29 DIAGNOSIS — E785 Hyperlipidemia, unspecified: Secondary | ICD-10-CM | POA: Diagnosis not present

## 2021-04-29 DIAGNOSIS — G8929 Other chronic pain: Secondary | ICD-10-CM | POA: Diagnosis not present

## 2021-04-29 DIAGNOSIS — R748 Abnormal levels of other serum enzymes: Secondary | ICD-10-CM

## 2021-04-29 LAB — COMPREHENSIVE METABOLIC PANEL
ALT: 45 U/L — ABNORMAL HIGH (ref 0–35)
AST: 31 U/L (ref 0–37)
Albumin: 4.6 g/dL (ref 3.5–5.2)
Alkaline Phosphatase: 75 U/L (ref 39–117)
BUN: 13 mg/dL (ref 6–23)
CO2: 26 mEq/L (ref 19–32)
Calcium: 9.5 mg/dL (ref 8.4–10.5)
Chloride: 104 mEq/L (ref 96–112)
Creatinine, Ser: 0.71 mg/dL (ref 0.40–1.20)
GFR: 90.71 mL/min (ref >60.00)
Glucose, Bld: 99 mg/dL (ref 70–99)
Potassium: 4 mEq/L (ref 3.5–5.1)
Sodium: 138 mEq/L (ref 135–145)
Total Bilirubin: 0.9 mg/dL (ref 0.2–1.2)
Total Protein: 7 g/dL (ref 6.0–8.3)

## 2021-04-29 LAB — CBC WITH DIFFERENTIAL/PLATELET
Basophils Absolute: 0.1 10*3/uL (ref 0.0–0.1)
Basophils Relative: 0.8 % (ref 0.0–3.0)
Eosinophils Absolute: 0.2 10*3/uL (ref 0.0–0.7)
Eosinophils Relative: 2.6 % (ref 0.0–5.0)
HCT: 43.6 % (ref 36.0–46.0)
Hemoglobin: 14.7 g/dL (ref 12.0–15.0)
Lymphocytes Relative: 33.5 % (ref 12.0–46.0)
Lymphs Abs: 2.3 10*3/uL (ref 0.7–4.0)
MCHC: 33.7 g/dL (ref 30.0–36.0)
MCV: 90.3 fl (ref 78.0–100.0)
Monocytes Absolute: 0.5 10*3/uL (ref 0.1–1.0)
Monocytes Relative: 7.9 % (ref 3.0–12.0)
Neutro Abs: 3.8 10*3/uL (ref 1.4–7.7)
Neutrophils Relative %: 55.2 % (ref 43.0–77.0)
Platelets: 279 10*3/uL (ref 150.0–400.0)
RBC: 4.83 Mil/uL (ref 3.87–5.11)
RDW: 12.7 % (ref 11.5–15.5)
WBC: 6.9 10*3/uL (ref 4.0–10.5)

## 2021-04-29 LAB — TSH: TSH: 1.58 u[IU]/mL (ref 0.35–5.50)

## 2021-04-29 LAB — HEMOGLOBIN A1C: Hgb A1c MFr Bld: 6.1 % (ref 4.6–6.5)

## 2021-04-29 NOTE — Patient Instructions (Signed)
General Headache Without Cause A headache is pain or discomfort felt around the head or neck area. There are many causes and types of headaches. A few common types include: Tension headaches. Migraine headaches. Cluster headaches. Chronic daily headaches. Sometimes, the specific cause of a headache may not be found. Follow these instructions at home: Watch your condition for any changes. Let your health care provider know about them. Take these steps to help with your condition: Managing pain   Take over-the-counter and prescription medicines only as told by your health care provider. Treatment may include medicines for pain that are taken by mouth or applied to the skin. Lie down in a dark, quiet room when you have a headache. Keep lights dim if bright lights bother you or make your headaches worse. If directed, put ice on your head and neck area: Put ice in a plastic bag. Place a towel between your skin and the bag. Leave the ice on for 20 minutes, 2-3 times per day. Remove the ice if your skin turns bright red. This is very important. If you cannot feel pain, heat, or cold, you have a greater risk of damage to the area. If directed, apply heat to the affected area. Use the heat source that your health care provider recommends, such as a moist heat pack or a heating pad. Place a towel between your skin and the heat source. Leave the heat on for 20-30 minutes. Remove the heat if your skin turns bright red. This is especially important if you are unable to feel pain, heat, or cold. You have a greater risk of getting burned. Eating and drinking Eat meals on a regular schedule. If you drink alcohol: Limit how much you have to: 0-1 drink a day for women who are not pregnant. 0-2 drinks a day for men. Know how much alcohol is in a drink. In the [place] one drink equals one 12 oz bottle of beer (355 mL), one 5 oz glass of wine (148 mL), or one 1 oz glass of hard liquor (44 mL). Stop drinking  caffeine, or decrease the amount of caffeine you drink. Drink enough fluid to keep your urine pale yellow. General instructions  Keep a headache journal to help find out what may trigger your headaches. For example, write down: What you eat and drink. How much sleep you get. Any change to your diet or medicines. Try massage or other relaxation techniques. Limit stress. Sit up straight, and do not tense your muscles. Do not use any products that contain nicotine or tobacco. These products include cigarettes, chewing tobacco, and vaping devices, such as e-cigarettes. If you need help quitting, ask your health care provider. Exercise regularly as told by your health care provider. Sleep on a regular schedule. Get 7-9 hours of sleep each night, or the amount recommended by your health care provider. Keep all follow-up visits. This is important. Contact a health care provider if: Medicine does not help your symptoms. You have a headache that is different from your usual headache. You have nausea or you vomit. You have a fever. Get help right away if: Your headache: Becomes severe quickly. Gets worse after moderate to intense physical activity. You have any of these symptoms: Repeated vomiting. Pain or stiffness in your neck. Changes to your vision. Pain in an eye or ear. Problems with speech. Muscular weakness or loss of muscle control. Loss of balance or coordination. You feel faint or pass out. You have confusion. You have a seizure.  These symptoms may represent a serious problem that is an emergency. Do not wait to see if the symptoms will go away. Get medical help right away. Call your local emergency services (911 in the [place] Do not drive yourself to the hospital. Summary A headache is pain or discomfort felt around the head or neck area. There are many causes and types of headaches. In some cases, the cause may not be found. Keep a headache journal to help find out what may  trigger your headaches. Watch your condition for any changes. Let your health care provider know about them. Contact a health care provider if you have a headache that is different from the usual headache, or if your symptoms are not helped by medicine. Get help right away if your headache becomes severe, you vomit, you have a loss of vision, you lose your balance, or you have a seizure. This information is not intended to replace advice given to you by your health care provider. Make sure you discuss any questions you have with your health care provider. Document Revised: 10/09/2020 Document Reviewed: 10/09/2020 Elsevier Patient Education  2022 [employer] Form - Headache Record There are many types and causes of headaches. A headache record can help guide your treatment plan. Use this form to record the details. Bring this form with you to your follow-up visits. Follow your health care provider's instructions on how to describe your headache. You may be asked to: Use a pain scale. This is a tool to rate the intensity of your headache using words or numbers. Describe what your headache feels like, such as dull, achy, throbbing, or sharp. Headache record Date: _______________ Time (from start to end): ____________________ Location of the headache: _________________________ Intensity of the headache: ____________________ Description of the headache: ______________________________________________________________ Hours of sleep the night before the headache: __________ Food or drinks before the headache started: ______________________________________________________________________________________ Events before the headache started: _______________________________________________________________________________________________ Symptoms before the headache started: __________________________________________________________________________________________ Symptoms during the headache:  __________________________________________________________________________________________________ Treatment: ________________________________________________________________________________________________________________ Effect of treatment: _________________________________________________________________________________________________________ Other comments: ___________________________________________________________________________________________________________ Date: _______________ Time (from start to end): ____________________ Location of the headache: _________________________ Intensity of the headache: ____________________ Description of the headache: ______________________________________________________________ Hours of sleep the night before the headache: __________ Food or drinks before the headache started: ______________________________________________________________________________________ Events before the headache started: ____________________________________________________________________________________________ Symptoms before the headache started: _________________________________________________________________________________________ Symptoms during the headache: _______________________________________________________________________________________________ Treatment: ________________________________________________________________________________________________________________ Effect of treatment: _________________________________________________________________________________________________________ Other comments: ___________________________________________________________________________________________________________ Date: _______________ Time (from start to end): ____________________ Location of the headache: _________________________ Intensity of the headache: ____________________ Description of the headache:  ______________________________________________________________ Hours of sleep the night before the headache: __________ Food or drinks before the headache started: ______________________________________________________________________________________ Events before the headache started: ____________________________________________________________________________________________ Symptoms before the headache started: _________________________________________________________________________________________ Symptoms during the headache: _______________________________________________________________________________________________ Treatment: ________________________________________________________________________________________________________________ Effect of treatment: _________________________________________________________________________________________________________ Other comments: ___________________________________________________________________________________________________________ Date: _______________ Time (from start to end): ____________________ Location of the headache: _________________________ Intensity of the headache: ____________________ Description of the headache: ______________________________________________________________ Hours of sleep the night before the headache: _________ Food or drinks before the headache started: ______________________________________________________________________________________ Events before the headache started: ____________________________________________________________________________________________ Symptoms before the headache started: _________________________________________________________________________________________ Symptoms during the headache: _______________________________________________________________________________________________ Treatment:  ________________________________________________________________________________________________________________ Effect of treatment: _________________________________________________________________________________________________________ Other comments: ___________________________________________________________________________________________________________ Date: _______________ Time (from start to end): ____________________ Location of the headache: _________________________ Intensity of the headache: ____________________ Description of the headache: ______________________________________________________________ Hours of sleep the night before the headache: _________ Food or drinks before the headache started: ______________________________________________________________________________________ Events before the headache started: ____________________________________________________________________________________________ Symptoms before the  headache started: _________________________________________________________________________________________ Symptoms during the headache: _______________________________________________________________________________________________ Treatment: ________________________________________________________________________________________________________________ Effect of treatment: _________________________________________________________________________________________________________ Other comments: ___________________________________________________________________________________________________________ This information is not intended to replace advice given to you by your health care provider. Make sure you discuss any questions you have with your health care provider. Document Revised: 10/09/2020 Document Reviewed: 10/09/2020 Elsevier Patient Education  2022 [employer].
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
## 2021-04-29 NOTE — Progress Notes (Signed)
Pre visit review using our clinic review tool, if applicable. No additional management support is needed unless otherwise documented below in the visit note. 

## 2021-04-29 NOTE — Telephone Encounter (Signed)
Lft pt vm to call ofc to sch MRI. thanks 

## 2021-04-29 NOTE — Progress Notes (Signed)
Hemoglobin A1C is prediabetes.  CMP mild elevation of ALT. Avoid alcohol and tylenol.  Would advise to out of caution check acute hepatitis panel. Please add lab patient can schedule. Recheck CMP in 6 weeks.  CBC within normal limits.  TSH for thyroid within normal limits.

## 2021-04-30 ENCOUNTER — Ambulatory Visit
Admission: RE | Admit: 2021-04-30 | Discharge: 2021-04-30 | Disposition: A | Discharge status: Home / Self Care | Payer: Medicare Other | Source: Ambulatory Visit | Attending: Adult Health | Admitting: Adult Health

## 2021-04-30 DIAGNOSIS — G8929 Other chronic pain: Secondary | ICD-10-CM | POA: Diagnosis present

## 2021-04-30 DIAGNOSIS — R9389 Abnormal findings on diagnostic imaging of other specified body structures: Secondary | ICD-10-CM | POA: Diagnosis present

## 2021-04-30 DIAGNOSIS — R519 Headache, unspecified: Secondary | ICD-10-CM | POA: Insufficient documentation

## 2021-04-30 MED ORDER — GADOBUTROL 1 MMOL/ML IV SOLN
9.0000 mL | Freq: Once | INTRAVENOUS | Status: AC | PRN
  Administered 2021-04-30: 9 mL via INTRAVENOUS

## 2021-04-30 NOTE — Addendum Note (Signed)
Addended by: Elise Benne T on: 04/30/2021 10:33 AM   Modules accepted: Orders

## 2021-04-30 NOTE — Progress Notes (Signed)
Acute hepatitis panel.  Hep C antibody and CMP thank you. Dx elevaed liver enzymes.

## 2021-05-01 NOTE — Progress Notes (Signed)
  Lesion seen on right ventricle, do not have previous patient reported this in the past on MRI, Was referred to neurology urgently. She should hear from them within 1week.  Also has a right mastoid effusion would advise ENT evaluation if she is willing please ad referral.   IMPRESSION: 1. Solitary right periventricular T2 hyperintensity is noted adjacent to the frontal horn of the right lateral ventricle. This is nonspecific, but can be seen in the setting of chronic microvascular ischemia, a demyelinating process such as multiple sclerosis, vasculitis, complicated migraine headaches, or as the sequelae of a prior infectious or inflammatory process. 2. No acute intracranial abnormality. 3. Right mastoid effusion. No obstructing nasopharyngeal lesion is present.   Red Flags discussed. The patient was given clear instructions to go to ER or return to medical center if any red flags develop, symptoms do not improve, worsen or new problems develop. They verbalized understanding.     Electronically Signed   By: Marin Roberts M.D.   On: 04/30/2021 18:32

## 2021-05-14 ENCOUNTER — Ambulatory Visit: Payer: Medicare Other | Admitting: Adult Health

## 2021-05-15 ENCOUNTER — Ambulatory Visit: Payer: Medicare Other | Admitting: Adult Health

## 2021-06-11 ENCOUNTER — Other Ambulatory Visit (INDEPENDENT_AMBULATORY_CARE_PROVIDER_SITE_OTHER): Payer: Medicare Other

## 2021-06-11 ENCOUNTER — Other Ambulatory Visit: Payer: Self-pay

## 2021-06-11 DIAGNOSIS — R748 Abnormal levels of other serum enzymes: Secondary | ICD-10-CM | POA: Diagnosis not present

## 2021-06-11 LAB — COMPREHENSIVE METABOLIC PANEL
ALT: 45 U/L — ABNORMAL HIGH (ref 0–35)
AST: 32 U/L (ref 0–37)
Albumin: 4.7 g/dL (ref 3.5–5.2)
Alkaline Phosphatase: 74 U/L (ref 39–117)
BUN: 16 mg/dL (ref 6–23)
CO2: 31 mEq/L (ref 19–32)
Calcium: 9.4 mg/dL (ref 8.4–10.5)
Chloride: 102 mEq/L (ref 96–112)
Creatinine, Ser: 0.72 mg/dL (ref 0.40–1.20)
GFR: 89.13 mL/min (ref >60.00)
Glucose, Bld: 101 mg/dL — ABNORMAL HIGH (ref 70–99)
Potassium: 4.1 mEq/L (ref 3.5–5.1)
Sodium: 138 mEq/L (ref 135–145)
Total Bilirubin: 0.8 mg/dL (ref 0.2–1.2)
Total Protein: 7.2 g/dL (ref 6.0–8.3)

## 2021-06-12 LAB — HEPATITIS PANEL, ACUTE
Hep A IgM: NONREACTIVE
Hep B C IgM: NONREACTIVE
Hepatitis B Surface Ag: NONREACTIVE
Hepatitis C Ab: NONREACTIVE
SIGNAL TO CUT-OFF: 0.08 (ref <1.00)

## 2021-06-12 NOTE — Progress Notes (Signed)
Glucose mild elevation in CBC ok, monitor diet and increase exercise. ALT liver enzyme still elevated, referral to gastroenterology is advised does she have a preference on location/ MD  ?  Hepatitis A, B, C acute is all negative.

## 2021-06-13 ENCOUNTER — Other Ambulatory Visit: Payer: Self-pay | Admitting: Adult Health

## 2021-06-13 DIAGNOSIS — R748 Abnormal levels of other serum enzymes: Secondary | ICD-10-CM

## 2021-06-13 NOTE — Progress Notes (Signed)
GI referral placed should hear within 2 weeks.

## 2021-06-13 NOTE — Progress Notes (Signed)
Orders Placed This Encounter  Procedures  . Ambulatory referral to Gastroenterology    Referral Priority:  Routine    Referral Type:  Consultation    Referral Reason:  Specialty Services Required    Number of Visits Requested:  1     

## 2021-06-16 ENCOUNTER — Telehealth: Payer: Self-pay

## 2021-06-16 NOTE — Telephone Encounter (Signed)
Scheduled for 08/21/2021 °

## 2021-08-06 ENCOUNTER — Telehealth: Payer: Self-pay | Admitting: Adult Health

## 2021-08-06 NOTE — Telephone Encounter (Signed)
Spoke with patient to schedule Medicare Annual Wellness Visit (AWV) either virtually or in office. ? ?Patient declined did not  want to do  ? ?Last AWV 04/14/17 ? please schedule at anytime with health coach ? ?This should be a 45 minute visit.  ?

## 2021-08-21 ENCOUNTER — Ambulatory Visit: Payer: Medicare Other | Admitting: Gastroenterology

## 2021-08-21 ENCOUNTER — Other Ambulatory Visit: Payer: Self-pay

## 2021-08-21 ENCOUNTER — Encounter: Payer: Self-pay | Admitting: Gastroenterology

## 2021-08-21 VITALS — BP 152/71 | HR 56 | Temp 98.8°F | Ht 67.0 in | Wt 196.5 lb

## 2021-08-21 DIAGNOSIS — R7989 Other specified abnormal findings of blood chemistry: Secondary | ICD-10-CM | POA: Diagnosis not present

## 2021-08-21 NOTE — Progress Notes (Signed)
?  ?Cephas Darby, MD ?[address]  ?[address]  ?[place], [place] [address]  ?Main: [phone]  ?Fax: [phone] ? ? ? ?Gastroenterology Consultation ? ?Referring Provider:     Sharmon Leyden* ?Primary Care Physician:  Doreen Beam, FNP ?Primary Gastroenterologist:  Dr. Cephas Darby ?Reason for Consultation:     Elevated transaminases ?      ? HPI:   ?Suzanne [name] is a 66 y.o. female referred by Dr. Doreen Beam, FNP  for consultation & management of elevated LFTs.  Patient is referred to GI for further evaluation of elevated ALT.  She is first noticed to have elevated ALT in 06/2020, within 2 times upper limit of normal.  ALT 45, AST alkaline phosphatase and T. bili were normal.  Viral hepatitis panel was negative.  Patient was prediabetic, hemoglobin A1c 6.1.  Patient denies any GI symptoms.  She denies consumption of carbonated beverages.  She moved from [place] about 2 years ago ? ? ?  Latest Ref Rng & Units 06/11/2021  ?  9:48 AM 04/29/2021  ? 11:48 AM 07/18/2020  ?  7:59 AM  ?Hepatic Function  ?Total Protein 6.0 - 8.3 g/dL 7.2   7.0   6.9    ?Albumin 3.5 - 5.2 g/dL 4.7   4.6   4.6    ?AST 0 - 37 U/L 32   31   29    ?ALT 0 - 35 U/L 45   45   47    ?Alk Phosphatase 39 - 117 U/L 74   75   83    ?Total Bilirubin 0.2 - 1.2 mg/dL 0.8   0.9   0.5    ? ?Family history of alcohol abuse.  She denies any alcohol use, denies any blood transfusions, IV drug abuse ? ?NSAIDs: None ? ?Antiplts/Anticoagulants/Anti thrombotics: None ? ?GI Procedures: Reports undergoing colonoscopy in [place] 4 years ago, normal ?Denies any family history of GI malignancy ? ?Past Medical History:  ?Diagnosis Date  ? Arthritis   ? Cataract   ? Chicken pox   ? Hyperlipidemia   ? Ulcer   ? ? ?Past Surgical History:  ?Procedure Laterality Date  ? EYE SURGERY    ? FRACTURE SURGERY    ? JOINT REPLACEMENT    ? TKR Right, 2014  ? SPINE SURGERY    ? ? ?Current Outpatient Medications:  ?  Cholecalciferol (VITAMIN  D3) 50 MCG (2000 UT) CAPS, Take by mouth., Disp: , Rfl:  ?  gabapentin (NEURONTIN) 300 MG capsule, Take by mouth., Disp: , Rfl:  ? ? ?Family History  ?Problem Relation Age of Onset  ? Arthritis Mother   ? Hyperlipidemia Mother   ? Hypertension Mother   ? Alcohol abuse Father   ? Arthritis Father   ? Hyperlipidemia Father   ? Hypertension Father   ? Diabetes Father   ? Breast cancer Paternal Aunt   ?     DOSENT KNOW AGE  ? Breast cancer Cousin 53  ?  ? ?Social History  ? ?Tobacco Use  ? Smoking status: Never  ? Smokeless tobacco: Never  ?Substance Use Topics  ? Alcohol use: Yes  ?  Comment: seldom  ? Drug use: No  ? ? ?Allergies as of 08/21/2021 - Review Complete 08/21/2021  ?Allergen Reaction Noted  ? Aspirin  02/21/2019  ? ? ?Review of Systems:    ?All systems reviewed and negative except where noted in HPI. ? ? Physical  Exam:  ?BP (!) 152/71 (BP Location: Left Arm, Patient Position: Sitting, Cuff Size: Normal)   Pulse (!) 56   Temp 98.8 ?F (37.1 ?C) (Oral)   Ht _0  (1.702 m)   Wt 196 lb 8 oz (89.1 kg)   BMI 30.78 kg/m?  ?No LMP recorded. Patient has had an ablation. ? ?General:   Alert,  Well-developed, well-nourished, pleasant and cooperative in NAD ?Head:  Normocephalic and atraumatic. ?Eyes:  Sclera clear, no icterus.   Conjunctiva pink. ?Ears:  Normal auditory acuity. ?Nose:  No deformity, discharge, or lesions. ?Mouth:  No deformity or lesions,oropharynx pink & moist. ?Neck:  Supple; no masses or thyromegaly. ?Lungs:  Respirations even and unlabored.  Clear throughout to auscultation.   No wheezes, crackles, or rhonchi. No acute distress. ?Heart:  Regular rate and rhythm; no murmurs, clicks, rubs, or gallops. ?Abdomen:  Normal bowel sounds. Soft, non-tender and non-distended without masses, hepatosplenomegaly or hernias noted.  No guarding or rebound tenderness.   ?Rectal: Not performed ?Msk:  Symmetrical without gross deformities. Good, equal movement & strength bilaterally. ?Pulses:  Normal pulses  noted. ?Extremities:  No clubbing or edema.  No cyanosis. ?Neurologic:  Alert and oriented x3;  grossly normal neurologically. ?Skin:  Intact without significant lesions or rashes. No jaundice. ?Psych:  Alert and cooperative. Normal mood and affect. ? ?Imaging Studies: ?None ? ?Assessment and Plan:  ? ?Suzanne [name] is a 66 y.o. female with BMI 30.78 is seen in consultation for chronically elevated ALT ? ?Elevated ALT, likely fatty liver ?Viral hepatitis panel unremarkable ?Recommend secondary liver disease work-up ?Recommend right upper quadrant ultrasound ?Recheck LFTs in 6 months ? ?Follow up in 6 months ? ? ?Cephas Darby, MD ? ?

## 2021-08-21 NOTE — Patient Instructions (Addendum)
[facility] arrive at 9:00am for a 9:30am appointment. nothing to eat or drink after midnight. ?

## 2021-08-25 LAB — CELIAC DISEASE PANEL
Endomysial IgA: NEGATIVE
IgA/Immunoglobulin A, Serum: 181 mg/dL (ref 87–352)
Transglutaminase IgA: 3 U/mL (ref 0–3)

## 2021-08-25 LAB — IRON,TIBC AND FERRITIN PANEL
Ferritin: 462 ng/mL — ABNORMAL HIGH (ref 15–150)
Iron Saturation: 25 % (ref 15–55)
Iron: 68 ug/dL (ref 27–139)
Total Iron Binding Capacity: 271 ug/dL (ref 250–450)
UIBC: 203 ug/dL (ref 118–369)

## 2021-08-25 LAB — ALPHA-1-ANTITRYPSIN: A-1 Antitrypsin: 151 mg/dL (ref 101–187)

## 2021-08-25 LAB — ANA: ANA Titer 1: NEGATIVE

## 2021-08-25 LAB — CERULOPLASMIN: Ceruloplasmin: 21.5 mg/dL (ref 19.0–39.0)

## 2021-08-25 LAB — MITOCHONDRIAL ANTIBODIES: Mitochondrial Ab: <20 Units (ref 0.0–20.0)

## 2021-08-25 LAB — ANTI-SMOOTH MUSCLE ANTIBODY, IGG: Smooth Muscle Ab: 4 Units (ref 0–19)

## 2021-09-02 ENCOUNTER — Ambulatory Visit: Payer: Medicare Other

## 2021-09-04 ENCOUNTER — Other Ambulatory Visit: Payer: Self-pay

## 2021-09-04 ENCOUNTER — Ambulatory Visit
Admission: RE | Admit: 2021-09-04 | Discharge: 2021-09-04 | Disposition: A | Discharge status: Home / Self Care | Payer: Medicare Other | Source: Ambulatory Visit | Attending: Gastroenterology | Admitting: Gastroenterology

## 2021-09-04 DIAGNOSIS — R7989 Other specified abnormal findings of blood chemistry: Secondary | ICD-10-CM | POA: Diagnosis present

## 2022-02-23 ENCOUNTER — Ambulatory Visit: Payer: Medicare Other | Admitting: Gastroenterology

## 2022-03-11 ENCOUNTER — Ambulatory Visit: Payer: Self-pay | Admitting: *Deleted

## 2022-03-11 NOTE — Telephone Encounter (Signed)
Reason for Disposition  [1] MODERATE pain (e.g., interferes with normal activities, limping) AND [2] present > 3 days  Answer Assessment - Initial Assessment Questions 1. LOCATION and RADIATION: "Where is the pain located?"      I've had cortisone shots in both hips.    I have a memory foam mattress.   The pain wakes me up.   2. QUALITY: "What does the pain feel like?"  (e.g., sharp, dull, aching, burning)     I need to been seen for this hip pain. She got scheduled for 03/12/2022 at 8:40 with Tally Joe. 3. SEVERITY: "How bad is the pain?" "What does it keep you from doing?"   (Scale 1-10; or mild, moderate, severe)   -  MILD (1-3): doesn't interfere with normal activities    -  MODERATE (4-7): interferes with normal activities (e.g., work or school) or awakens from sleep, limping    -  SEVERE (8-10): excruciating pain, unable to do any normal activities, unable to walk     Severe pain that is waking me up. 4. ONSET: "When did the pain start?" "Does it come and go, or is it there all the time?"     This is a chronic problem.   She has had 3 cortisone shots in each hip. 5. WORK OR EXERCISE: "Has there been any recent work or exercise that involved this part of the body?"      Not asked 6. CAUSE: "What do you think is causing the hip pain?"      Arthritis or some kind of inflammation 7. AGGRAVATING FACTORS: "What makes the hip pain worse?" (e.g., walking, climbing stairs, running)     Not asked 8. OTHER SYMPTOMS: "Do you have any other symptoms?" (e.g., back pain, pain shooting down leg,  fever, rash)     Not asked.  Protocols used: Hip Pain-A-AH

## 2022-03-11 NOTE — Progress Notes (Unsigned)
Established patient visit   Patient: Suzanne [name]   DOB: 15-Aug-1955   66 y.o. Female  MRN: [id number] Visit Date: 03/12/2022  Today's healthcare provider: Gwyneth Sprout, FNP  Introduced to nurse practitioner role and practice setting.  All questions answered.  Discussed provider/patient relationship and expectations.  I,Epifania Littrell J Tabitha Tupper,acting as a scribe for Gwyneth Sprout, FNP.,have documented all relevant documentation on the behalf of Gwyneth Sprout, FNP,as directed by  Gwyneth Sprout, FNP while in the presence of Gwyneth Sprout, FNP.   Chief Complaint  Patient presents with   Hip Pain    Patient complains of bilateral hip pain for years, had cortisone shots but does not help long term. Has not seen ortho locally yet.    Depression    Patient states she would like to have therapy, feels she has too much on her shoulders and is not handling it well.    Subjective    HPI HPI     Hip Pain    Additional comments: Patient complains of bilateral hip pain for years, had cortisone shots but does not help long term. Has not seen ortho locally yet.         Depression    Additional comments: Patient states she would like to have therapy, feels she has too much on her shoulders and is not handling it well.       Last edited by Smitty Knudsen, CMA on 03/12/2022  9:10 AM.      Medications: Outpatient Medications Prior to Visit  Medication Sig   [DISCONTINUED] Blood Pressure Monitoring (BLOOD PRESSURE MONITOR AUTOMAT) DEVI TAKE ONE CAPSULE BY MOUTH EVERY 7 DAYS   [DISCONTINUED] tiZANidine (ZANAFLEX) 4 MG tablet Take 1 tablet every 6 hours by oral route as needed.   [DISCONTINUED] traZODone (DESYREL) 50 MG tablet TAKE 1/2 TO 1 TABLET BY MOUTH AT BEDTIME AS NEEDED FOR SLEEP. WILL CAUSE DROWSINESS   Cholecalciferol (VITAMIN D3) 50 MCG (2000 UT) CAPS Take by mouth. (Patient not taking: Reported on 03/12/2022)   [DISCONTINUED] ezetimibe (ZETIA) 10 MG tablet  (Patient not  taking: Reported on 03/12/2022)   [DISCONTINUED] gabapentin (NEURONTIN) 100 MG capsule    [DISCONTINUED] gabapentin (NEURONTIN) 300 MG capsule Take by mouth.   No facility-administered medications prior to visit.    Review of Systems    Objective    BP (!) 140/73 (BP Location: Left Arm, Patient Position: Sitting, Cuff Size: Normal)   Pulse 60   Temp 97.9 F (36.6 C) (Oral)   Resp 16   Ht 5\' 5"  (1.651 m)   Wt 197 lb (89.4 kg)   SpO2 99%   BMI 32.78 kg/m   Physical Exam Vitals and nursing note reviewed.  Constitutional:      General: She is not in acute distress.    Appearance: Normal appearance. She is obese. She is not ill-appearing, toxic-appearing or diaphoretic.  HENT:     Head: Normocephalic and atraumatic.  Cardiovascular:     Rate and Rhythm: Normal rate and regular rhythm.     Pulses: Normal pulses.     Heart sounds: Normal heart sounds. No murmur heard.    No friction rub. No gallop.  Pulmonary:     Effort: Pulmonary effort is normal. No respiratory distress.     Breath sounds: Normal breath sounds. No stridor. No wheezing, rhonchi or rales.  Chest:     Chest wall: No tenderness.  Abdominal:     General:  Bowel sounds are normal.     Palpations: Abdomen is soft.  Musculoskeletal:        General: No swelling, tenderness, deformity or signs of injury. Normal range of motion.     Right lower leg: No edema.     Left lower leg: No edema.  Skin:    General: Skin is warm and dry.     Capillary Refill: Capillary refill takes less than 2 seconds.     Coloration: Skin is not jaundiced or pale.     Findings: No bruising, erythema, lesion or rash.  Neurological:     General: No focal deficit present.     Mental Status: She is alert and oriented to person, place, and time. Mental status is at baseline.     Cranial Nerves: No cranial nerve deficit.     Sensory: No sensory deficit.     Motor: No weakness.     Coordination: Coordination normal.  Psychiatric:         Mood and Affect: Mood is depressed. Affect is blunt, flat and tearful.        Behavior: Behavior normal.        Thought Content: Thought content normal. Thought content does not include homicidal or suicidal ideation. Thought content does not include homicidal or suicidal plan.        Cognition and Memory: Cognition normal.        Judgment: Judgment normal.     No results found for any visits on 03/12/22.  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      Relevant Medications   DULoxetine (CYMBALTA) 20 MG capsule   Other Relevant Orders   AMB Referral to Chronic Care Management Services   Ambulatory referral to Psychology   Bilateral hip pain    Chronic, worsening Declines referral to ortho at this time given pending travel to assist daughter; defer further injections given previous failure and 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       Depression, major, single episode, severe (HCC) - Primary    Acute, ongoing Denies SI or HI Agreeable to start of low dose Cymalta to assist Referral placed to patient's preferred institutions Referral placed to CCM Hx of estranged marriage, daughter with 25 year hx of ongoing substance use with recent separation from her second husband and kids      Relevant Medications   DULoxetine (CYMBALTA) 20 MG capsule   Other Relevant Orders   AMB Referral to Chronic Care Management Services   Ambulatory referral to Psychology   Restless leg syndrome    Chronic, stable Refill gaba 300 qHS to assist       Relevant Medications   gabapentin (NEURONTIN) 300 MG capsule   Return in about 8 weeks (around 05/07/2022) for anxiety and depression.     Leilani Merl, FNP, have reviewed all documentation for  this visit. The documentation on 03/12/22 for the exam, diagnosis, procedures, and orders are all accurate and complete.  Jacky Kindle, FNP  [facility] [phone] (phone) [phone] (fax)  [facility]

## 2022-03-11 NOTE — Telephone Encounter (Signed)
  Chief Complaint: hip pain.   Has had 3 cortisone shots in both hips.   Requesting to be evaluated again. Symptoms: bilateral hip pain Frequency: chronic Pertinent Negatives: Patient denies OTC pain medications helping her. Disposition: [] ED /[] Urgent Care (no appt availability in office) / [x] Appointment(In office/virtual)/ []  [facility]/ [] Home Care/ [] Refused Recommended Disposition /[]  Mobile Bus/ []  Follow-up with PCP Additional Notes: Pt is scheduled to see Tally Joe 03/12/2022 at 8:40.

## 2022-03-12 ENCOUNTER — Ambulatory Visit (INDEPENDENT_AMBULATORY_CARE_PROVIDER_SITE_OTHER): Payer: Medicare Other | Admitting: Family Medicine

## 2022-03-12 ENCOUNTER — Encounter: Payer: Self-pay | Admitting: Family Medicine

## 2022-03-12 VITALS — BP 140/73 | HR 60 | Temp 97.9°F | Resp 16 | Ht 65.0 in | Wt 197.0 lb

## 2022-03-12 DIAGNOSIS — G2581 Restless legs syndrome: Secondary | ICD-10-CM

## 2022-03-12 DIAGNOSIS — F322 Major depressive disorder, single episode, severe without psychotic features: Secondary | ICD-10-CM | POA: Diagnosis not present

## 2022-03-12 DIAGNOSIS — F430 Acute stress reaction: Secondary | ICD-10-CM | POA: Insufficient documentation

## 2022-03-12 DIAGNOSIS — M25551 Pain in right hip: Secondary | ICD-10-CM

## 2022-03-12 DIAGNOSIS — M25552 Pain in left hip: Secondary | ICD-10-CM

## 2022-03-12 MED ORDER — GABAPENTIN 300 MG PO CAPS: 300.0000 mg | ORAL_CAPSULE | Freq: Every day | ORAL | 3 refills | Status: DC

## 2022-03-12 MED ORDER — DULOXETINE HCL 20 MG PO CPEP: 20.0000 mg | ORAL_CAPSULE | Freq: Every day | ORAL | 3 refills | Status: DC

## 2022-03-12 NOTE — Assessment & Plan Note (Signed)
Acute, ongoing Denies SI or HI Agreeable to start of low dose Cymalta to assist Referral placed to patient's preferred institutions Referral placed to CCM Hx of estranged marriage, daughter with 25 year hx of ongoing substance use with recent separation from her second husband and kids

## 2022-03-12 NOTE — Assessment & Plan Note (Signed)
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

## 2022-03-12 NOTE — Assessment & Plan Note (Signed)
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

## 2022-03-12 NOTE — Assessment & Plan Note (Signed)
Chronic, stable Refill gaba 300 qHS to assist

## 2022-03-24 ENCOUNTER — Telehealth: Payer: Self-pay

## 2022-03-24 NOTE — Chronic Care Management (AMB) (Signed)
  Care Coordination   Note   03/24/2022 Name: Suzanne [name] MRN: [id number] DOB: [birth year]  Suzanne [name] is a 66 y.o. year old female who sees Gwyneth Sprout, FNP for primary care. I reached out to [place] by phone today to offer care coordination services.  Suzanne [name] was given information about Care Coordination services today including:   The Care Coordination services include support from the care team which includes your Nurse Coordinator, Clinical Social Worker, or Pharmacist.  The Care Coordination team is here to help remove barriers to the health concerns and goals most important to you. Care Coordination services are voluntary, and the patient may decline or stop services at any time by request to their care team member.   Care Coordination Consent Status: Patient agreed to services and verbal consent obtained.   Follow up plan:  Telephone appointment with care coordination team member scheduled for:  03/27/2022  Encounter Outcome:  Pt. Scheduled  Noreene Larsson, [place],  [id number] Direct Dial: [phone] [email or web address]

## 2022-03-24 NOTE — Chronic Care Management (AMB) (Signed)
  Chronic Care Management   Note  03/24/2022 Name: Elverna Caffee MRN: [id number] DOB: 03-08-1956  Gabriela Irigoyen is a 66 y.o. year old female who is a primary care patient of Gwyneth Sprout, FNP. I reached out to Walker Shadow by phone today in response to a referral sent by Ms. Alva Garnet Kroon's PCP.  Ms. Nawrot was given information about Chronic Care Management services today including:  CCM service includes personalized support from designated clinical staff supervised by her physician, including individualized plan of care and coordination with other care providers 24/7 contact phone numbers for assistance for urgent and routine care needs. Service will only be billed when office clinical staff spend 20 minutes or more in a month to coordinate care. Only one practitioner may furnish and bill the service in a calendar month. The patient may stop CCM services at any time (effective at the end of the month) by phone call to the office staff. The patient is responsible for co-pay (up to 20% after annual deductible is met) if co-pay is required by the individual health plan.   Patient agreed to services and verbal consent obtained.   Follow up plan: Telephone appointment with care management team member scheduled GUY:QIHK 04/07/2022  Noreene Larsson, [place], [place] [address] Direct Dial: [phone] [email or web address]

## 2022-03-27 ENCOUNTER — Ambulatory Visit: Payer: Self-pay | Admitting: *Deleted

## 2022-03-30 ENCOUNTER — Telehealth: Payer: Self-pay | Admitting: *Deleted

## 2022-03-30 NOTE — Patient Instructions (Signed)
Visit Information  Thank you for taking time to visit with me today. Please don't hesitate to contact me if I can be of assistance to you.   Following are the goals we discussed today:   Goals Addressed             This Visit's Progress    Patient Stated       Care Coordination Interventions: Patient verbalized feeling of stress and concern related to the relationship with her daughter  Discussed daughter's move out of town, and hernow guardianship of her 15 year old grandson Verbalization of feelings encouraged PHQ2/PHQ9 completed Solution-Focused Strategies employed:  Active listening / Reflection utilized  Emotional Support Provided Discussed self-care action plan: Developing boundaries with family as well as active participation in counseling encouraged         Our next appointment is by telephone on 04/03/22 at 3pm  Please call the care guide team at [phone] if you need to cancel or reschedule your appointment.   If you are experiencing a Mental Health or [place] or need someone to talk to, please call the Suicide and Crisis Lifeline: 988   Patient verbalizes understanding of instructions and care plan provided today and agrees to view in [facility]. Active MyChart status and patient understanding of how to access instructions and care plan via MyChart confirmed with patient.     Telephone follow up appointment with care management team member scheduled for: 04/03/22  Elliot Gurney, [place] Worker  [facility] Care Management [phone]

## 2022-03-30 NOTE — Patient Outreach (Signed)
  Care Coordination   Initial Visit Note   03/30/2022 Name: Haevyn Ury MRN: [id number] DOB: 04-May-1956  Bettina Warn is a 66 y.o. year old female who sees Gwyneth Sprout, FNP for primary care. I spoke with  Walker Shadow by phone today.  What matters to the patients health and wellness today?  Mental health counseling and support    Goals Addressed             This Visit's Progress    Patient Stated       Care Coordination Interventions: Patient verbalized feeling of stress and concern related to the relationship with her daughter  Discussed daughter's move out of town, and hernow guardianship of her 15 year old grandson Verbalization of feelings encouraged PHQ2/PHQ9 completed Solution-Focused Strategies employed:  Active listening / Reflection utilized  Emotional Support Provided Discussed self-care action plan: Developing boundaries with family as well as active participation in counseling encouraged         SDOH assessments and interventions completed:  Yes  SDOH Interventions Today    Flowsheet Row Most Recent Value  SDOH Interventions   Food Insecurity Interventions Intervention Not Indicated  Housing Interventions Intervention Not Indicated  Transportation Interventions Intervention Not Indicated  Depression Interventions/Treatment  Referral to Psychiatry, Counseling        Care Coordination Interventions Activated:  Yes  Care Coordination Interventions:  Yes, provided   Follow up plan: Follow up call scheduled for 04/03/22    Encounter Outcome:  Pt. Visit Completed

## 2022-03-30 NOTE — Patient Outreach (Signed)
Phone call to [facility] to confirm both medication management and mental health counseling availability at their practice. VM left for a return call.   Elliot Gurney, [address] Worker  [facility] Care Management [phone]

## 2022-04-03 ENCOUNTER — Ambulatory Visit: Payer: Self-pay | Admitting: *Deleted

## 2022-04-03 NOTE — Patient Outreach (Signed)
  Care Coordination   04/03/2022 Name: Darice Vicario MRN: [id number] DOB: 04/03/1956   Care Coordination Outreach Attempts:  An unsuccessful telephone outreach was attempted for a scheduled appointment today.  Follow Up Plan:  Additional outreach attempts will be made to offer the patient care coordination information and services.   Encounter Outcome:  No Answer  Care Coordination Interventions Activated:  No   Care Coordination Interventions:  No, not indicated    Stoy Fenn, LCSW Clinical Social Worker  [facility] Care Management [phone]

## 2022-04-07 ENCOUNTER — Ambulatory Visit (INDEPENDENT_AMBULATORY_CARE_PROVIDER_SITE_OTHER): Payer: Medicare Other

## 2022-04-07 DIAGNOSIS — I10 Essential (primary) hypertension: Secondary | ICD-10-CM

## 2022-04-07 DIAGNOSIS — F322 Major depressive disorder, single episode, severe without psychotic features: Secondary | ICD-10-CM

## 2022-04-13 NOTE — Chronic Care Management (AMB) (Signed)
Chronic Care Management   CCM RN Visit Note   Name: Suzanne [name] MRN: [id number] DOB: [birth year]  Subjective: Suzanne [name] is a 66 y.o. year old female who is a primary care patient of Jacky Kindle, FNP. The patient was referred to the Chronic Care Management team for assistance with care management needs subsequent to provider initiation of CCM services and plan of care.    Today's Visit:  Engaged with patient by telephone for initial visit.     SDOH Interventions Today    Flowsheet Row Most Recent Value  SDOH Interventions   Food Insecurity Interventions Intervention Not Indicated  Transportation Interventions Intervention Not Indicated  Utilities Interventions Intervention Not Indicated  Depression Interventions/Treatment  Counseling, Medication  Stress Interventions Other (Comment)  [Receiving counseling]         Goals Addressed             This Visit's Progress    Goal: CCM (Depression) Expected Outcome: Monitor, Self-Manage and Reduce Symptoms of Depression       Current Barriers:  Chronic Disease Management support and education needs related to Depression  Planned Interventions: Reviewed plan for depression r/t caregiver stress. Reports taking Cymbalta as prescribed. However, feels that the medication is causing excessive drowsiness causing her to be less active. She is aware of expected timeframe for medication to take effect. Unsure if she wants to continue this medication. Will discuss with PCP. PHQ2/PHQ9 complete. Patient acknowledges feelings of stress and depression r/t mobility limitations d/t joint pain. Also acknowledges concerns r/t to her daughter. Reports spouse is primary source of support. Discussed referral for Psychology services. Will follow up regarding referral placement. Denies suicidal ideation. Patient is currently active with the Care Coordination LCSW. Active Listening/Reflection Utilized Emotional support  provided.  Symptom Management: Take all medications as prescribed Attend all scheduled provider appointments Call pharmacy for medication refills 3-7 days in advance of running out of medications Call provider office for new concerns or questions  If you require assistance or need to speak with someone r/t stress: Call 911 if experiencing a Mental Health or Behavioral Health Crisis  Call the Suicide and Crisis Lifeline: 988 Call 1-800-273-TALK (toll free, 24 hour hotline)  Follow Up Plan:  Will follow up in two weeks       Goal: CCM (Hypertension) Expected Outcome: Monitor, Self-Manage and Reduce Symptoms of Hypertension       Current Barriers:  Chronic Disease Management support and education needs related to HTN  Planned Interventions: Reviewed plan for hypertension management. Currently being managed without medications.  Provided information regarding established blood pressure parameters along with indications for notifying a provider. Advised to monitor BP daily and maintain a log.  Discussed activity tolerance. Reports being unable to engage in exercise d/t joint pain. Also expressed concerns r/t balance d/t foot drop. Reviewed safety and fall prevention measures. Reports having a walker to use if needed. Discussed compliance with recommended cardiac prudent diet. Encouraged to read nutrition labels, monitor sodium intake, and avoid highly processed foods when possible. Discussed complications of uncontrolled blood pressure.  Reviewed s/sx of heart attack, stroke and worsening symptoms that require immediate medical attention.  Symptom Management: Take medications as prescribed   Attend all scheduled provider appointments Call pharmacy for medication refills 3-7 days in advance of running out of medications Call provider office for new concerns or questions  Check blood pressure daily Write blood pressure results in a log or diary Call doctor for signs and symptoms  of high  blood pressure Increase intake of whole grains, fruits and vegetables Read nutrition labels, monitor sodium intake, and avoid highly processed foods when possible.  Follow Up Plan:  Will follow up in two weeks          Plan: Will follow up in two weeks.   Katha Cabal RN Care Manager/Chronic Care Management [phone]

## 2022-04-13 NOTE — Chronic Care Management (AMB) (Signed)
Chronic Care Management Provider Comprehensive Care Plan     Name: Suzanne [name] MRN: [id number] DOB: [birth year]  Referral to Chronic Care Management (CCM) services was placed by Provider:  Merita Norton on Date: 03/12/22.  Chronic Condition 1 and 2: HTN and Depression Provider Assessment and Plan         Acute, ongoing Denies SI or HI Agreeable to start of low dose Cymalta to assist Referral placed to patient's preferred institutions Referral placed to CCM Hx of estranged marriage, daughter with 25 year hx of ongoing substance use with recent separation from her second husband and kids          Expected Outcome/Goals Addressed This Visit (Provider CCM goals/Provider Assessment and plan  Goal: CCM (Hypertension) Expected Outcome: Monitor, Self-Manage and Reduce Symptoms of Hypertension  Symptom Management Condition 1: Take medications as prescribed   Attend all scheduled provider appointments Call pharmacy for medication refills 3-7 days in advance of running out of medications Call provider office for new concerns or questions  Check blood pressure daily Write blood pressure results in a log or diary Call doctor for signs and symptoms of high blood pressure Increase intake of whole grains, fruits and vegetables Read nutrition labels, monitor sodium intake, and avoid highly processed foods when possible.  Chronic Condition 2: Depression Provider Assessment and Plan  Depression, major, single episode, severe (HCC) - Primary       Acute, ongoing Denies SI or HI Agreeable to start of low dose Cymalta to assist Referral placed to patient's preferred institutions Referral placed to CCM Hx of estranged marriage, daughter with 25 year hx of ongoing substance use with recent separation from her second husband and kids          Expected Outcome/Goals Addressed This Visit (Provider CCM goals/Provider Assessment and plan  Goal: CCM (Depression) Expected Outcome: Monitor,  Self-Manage and Reduce Symptoms of Depression  Symptom Management Condition 2: Take all medications as prescribed Attend all scheduled provider appointments Call pharmacy for medication refills 3-7 days in advance of running out of medications Call provider office for new concerns or questions  If you require assistance or need to speak with someone r/t stress: Call 911 if experiencing a Mental Health or Behavioral Health Crisis  Call the Suicide and Crisis Lifeline: 988 Call 1-800-273-TALK (toll free, 24 hour hotline)  Problem List Patient Active Problem List   Diagnosis Date Noted   Depression, major, single episode, severe (HCC) 03/12/2022   Acute stress reaction 03/12/2022   Restless leg syndrome 03/12/2022   Bilateral hip pain 03/12/2022    Medication Management  Current Outpatient Medications:    Cholecalciferol (VITAMIN D3) 50 MCG (2000 UT) CAPS, Take by mouth. (Patient not taking: Reported on 03/12/2022), Disp: , Rfl:    DULoxetine (CYMBALTA) 20 MG capsule, Take 1 capsule (20 mg total) by mouth daily., Disp: 90 capsule, Rfl: 3   gabapentin (NEURONTIN) 300 MG capsule, Take 1 capsule (300 mg total) by mouth at bedtime., Disp: 90 capsule, Rfl: 3  Cognitive Assessment Identity Confirmed: : Name; DOB Cognitive Status: Normal   Functional Assessment Hearing Difficulty or Deaf: no Wear Glasses or Blind: no Concentrating, Remembering or Making Decisions Difficulty (CP): no Difficulty Communicating: no Difficulty Eating/Swallowing: no Walking or Climbing Stairs Difficulty: yes Mobility Management: Reports occassional difficulty ambulating d/t joint pain and foot drop Dressing/Bathing Difficulty: no Doing Errands Independently Difficulty (such as shopping) (CP): no Change in Functional Status Since Onset of Current Illness/Injury: no   Caregiver  Assessment  Primary Source of Support/Comfort: spouse; extended family Name of Support/Comfort Primary Source: [place] Deakins People in Home: spouse; grandchild(ren) Family Caregiver if Needed: none   Planned Interventions  HTN Reviewed plan for hypertension management. Currently being managed without medications.  Provided information regarding established blood pressure parameters along with indications for notifying a provider. Advised to monitor BP daily and maintain a log.  Discussed activity tolerance. Reports being unable to engage in exercise d/t joint pain. Also expressed concerns r/t balance d/t foot drop. Reviewed safety and fall prevention measures. Reports having a walker to use if needed. Discussed compliance with recommended cardiac prudent diet. Encouraged to read nutrition labels, monitor sodium intake, and avoid highly processed foods when possible. Discussed complications of uncontrolled blood pressure.  Reviewed s/sx of heart attack, stroke and worsening symptoms that require immediate medical attention.  Depression r/t Caregiver Stress Reviewed plan for depression r/t caregiver stress. Reports taking Cymbalta as prescribed. However, feels that the medication is causing excessive drowsiness causing her to be less active. She is aware of expected timeframe for medication to take effect. Unsure if she wants to continue this medication. Will discuss with PCP. PHQ2/PHQ9 complete. Patient acknowledges feelings of stress and depression r/t mobility limitations d/t joint pain. Also acknowledges concerns r/t to her daughter. Reports spouse is primary source of support. Discussed referral for Psychology services. Will follow up regarding referral placement. Denies suicidal ideation. Patient is currently active with the Care Coordination LCSW. Active Listening/Reflection Utilized Emotional support provided.    Interaction and coordination with outside resources, practitioners, and providers See CCM Referral  Care Plan: Patient declined

## 2022-04-13 NOTE — Patient Instructions (Signed)
Thank you for allowing the Chronic Care Management team to participate in your care.   Our next outreach is scheduled for 04/21/22 at 2 pm. Please call the care guide team at [phone] if you need to cancel or reschedule your appointment.   If you are experiencing a Mental Health or Behavioral Health Crisis or need someone to talk to,  -Please call the Suicide and Crisis Lifeline: 988 -Call the [place] National Suicide Prevention Lifeline: (530) 703-5448 or TTY: 304-771-0093 TTY 316-428-0112) to talk to a trained counselor -Call 911   Following is a copy of your full provider care plan:   Goals Addressed             This Visit's Progress    Goal: CCM (Depression) Expected Outcome: Monitor, Self-Manage and Reduce Symptoms of Depression       Current Barriers:  Chronic Disease Management support and education needs related to Depression  Planned Interventions: Reviewed plan for depression r/t caregiver stress. Reports taking Cymbalta as prescribed. However, feels that the medication is causing excessive drowsiness causing her to be less active. She is aware of expected timeframe for medication to take effect. Unsure if she wants to continue this medication. Will discuss with PCP. PHQ2/PHQ9 complete. Patient acknowledges feelings of stress and depression r/t mobility limitations d/t joint pain. Also acknowledges concerns r/t to her daughter. Reports spouse is primary source of support. Discussed referral for Psychology services. Will follow up regarding referral placement. Denies suicidal ideation. Patient is currently active with the Care Coordination LCSW. Active Listening/Reflection Utilized Emotional support provided.  Symptom Management: Take all medications as prescribed Attend all scheduled provider appointments Call pharmacy for medication refills 3-7 days in advance of running out of medications Call provider office for new concerns or questions  If you require assistance  or need to speak with someone r/t stress: Call 911 if experiencing a Mental Health or Behavioral Health Crisis  Call the Suicide and Crisis Lifeline: 988 Call 1-800-273-TALK (toll free, 24 hour hotline)  Follow Up Plan:  Will follow up in two weeks       Goal: CCM (Hypertension) Expected Outcome: Monitor, Self-Manage and Reduce Symptoms of Hypertension       Current Barriers:  Chronic Disease Management support and education needs related to HTN  Planned Interventions: Reviewed plan for hypertension management. Currently being managed without medications.  Provided information regarding established blood pressure parameters along with indications for notifying a provider. Advised to monitor BP daily and maintain a log.  Discussed activity tolerance. Reports being unable to engage in exercise d/t joint pain. Also expressed concerns r/t balance d/t foot drop. Reviewed safety and fall prevention measures. Reports having a walker to use if needed. Discussed compliance with recommended cardiac prudent diet. Encouraged to read nutrition labels, monitor sodium intake, and avoid highly processed foods when possible. Discussed complications of uncontrolled blood pressure.  Reviewed s/sx of heart attack, stroke and worsening symptoms that require immediate medical attention.   BP Readings from Last 3 Encounters:  03/12/22 (!) 140/73  08/21/21 (!) 152/71  04/29/21 138/66    Symptom Management: Take medications as prescribed   Attend all scheduled provider appointments Call pharmacy for medication refills 3-7 days in advance of running out of medications Call provider office for new concerns or questions  Check blood pressure daily Write blood pressure results in a log or diary Call doctor for signs and symptoms of high blood pressure Increase intake of whole grains, fruits and vegetables Read nutrition labels, monitor  sodium intake, and avoid highly processed foods when possible.  Follow Up  Plan:  Will follow up in two weeks          Mrs. Osborn verbalized understanding of instructions, educational materials, and care plan provided today. Declined offer to receive copy of patient instructions, educational materials, and care plan.   A member of the care management team will follow up in two weeks.    Katha Cabal RN Care Manager/Chronic Care Management [phone]

## 2022-04-21 ENCOUNTER — Ambulatory Visit: Payer: Medicare Other

## 2022-04-21 DIAGNOSIS — I10 Essential (primary) hypertension: Secondary | ICD-10-CM

## 2022-04-21 DIAGNOSIS — F322 Major depressive disorder, single episode, severe without psychotic features: Secondary | ICD-10-CM

## 2022-04-23 DIAGNOSIS — I10 Essential (primary) hypertension: Secondary | ICD-10-CM | POA: Diagnosis not present

## 2022-04-23 DIAGNOSIS — F32A Depression, unspecified: Secondary | ICD-10-CM | POA: Diagnosis not present

## 2022-04-27 NOTE — Chronic Care Management (AMB) (Signed)
Chronic Care Management   CCM RN Visit Note   Name: Pocahontas Cohenour MRN: [id number] DOB: 05-02-1956  Subjective: Natalija Mavis is a 66 y.o. year old female who is a primary care patient of Jacky Kindle, FNP. The patient was referred to the Chronic Care Management team for assistance with care management needs subsequent to provider initiation of CCM services and plan of care.    Today's Visit:  Engaged with patient by telephone for follow up visit.        Goals Addressed             This Visit's Progress    Goal: CCM (Depression) Expected Outcome: Monitor, Self-Manage and Reduce Symptoms of Depression       Current Barriers:  Chronic Disease Management support and education needs related to Depression  Planned Interventions: Reviewed medications and plan for management of depression. Reports not taking Cymbalta d/t decreased energy and excessive drowsiness. Prefers not to start additional medication for depression at this time. Patient reports concerns r/t her daughter and grandchildren continues to be a primary source of stress. Reports often feeling hopeless d/t inability to intervene with grandchildren. Notes grandchildren were abducted by their father and currently residing in another state.  Reviewed symptoms. Denies suicidal ideation.  Reports engaging in telephonic counseling with LCSW. Expressed interest in options for medications that will assist with weight loss. Attributes body image and excess weight to causing additional feelings of depression. Will relay concerns to PCP and discuss options.  Active Listening/Reflection Utilized Emotional support provided.  Symptom Management: Take all medications as prescribed Attend all scheduled provider appointments Call pharmacy for medication refills 3-7 days in advance of running out of medications Call provider office for new concerns or questions  If you require assistance or need to speak with someone r/t  stress: Call 911 if experiencing a Mental Health or Behavioral Health Crisis  Call the Suicide and Crisis Lifeline: 988 Call 1-800-273-TALK (toll free, 24 hour hotline)  Follow Up Plan:  Will follow up within the next month.       Goal: CCM (Hypertension) Expected Outcome: Monitor, Self-Manage and Reduce Symptoms of Hypertension       Current Barriers:  Chronic Disease Management support and education needs related to HTN  Planned Interventions: Reviewed plan for hypertension management. Currently being managed without medications.  Provided information regarding established blood pressure parameters along with indications for notifying a provider. Advised to monitor BP daily and maintain a log.  Discussed activity tolerance. Reports being unable to engage in exercise d/t joint pain. Also expressed concerns r/t balance d/t foot drop. Reviewed safety and fall prevention measures. Reports having a walker to use if needed. Discussed compliance with recommended cardiac prudent diet. Encouraged to read nutrition labels, monitor sodium intake, and avoid highly processed foods when possible. Discussed complications of uncontrolled blood pressure.  Update on 04/21/22: No changes to plan for hypertension management. Patient expressed interest in medications/injections to assist with weight loss. Feels this would assist with bp management and overall physical and mental health. Will discuss options with PCP. Reviewed s/sx of heart attack, stroke and worsening symptoms that require immediate medical attention. Reviewed pending appointments. Will follow up with PCP on 05/07/22.   Symptom Management: Take medications as prescribed   Attend all scheduled provider appointments Call pharmacy for medication refills 3-7 days in advance of running out of medications Call provider office for new concerns or questions  Check blood pressure daily Write blood pressure results  in a log or diary Call doctor  for signs and symptoms of high blood pressure Increase intake of whole grains, fruits and vegetables Read nutrition labels, monitor sodium intake, and avoid highly processed foods when possible.  Follow Up Plan: Will follow up next month          PLAN: Will follow up next month   Katha Cabal RN Care Manager/Chronic Care Management [phone]

## 2022-04-27 NOTE — Patient Instructions (Signed)
Thank you for allowing the Chronic Care Management team to participate in your care.   If you are experiencing a Mental Health or Behavioral Health Crisis or need someone to talk to,  please call the Suicide and Crisis Lifeline: 988 -Call the [place] National Suicide Prevention Lifeline: 678 058 9217 or TTY: (615)708-6443 TTY 919-528-7961) to talk to a trained counselor -Call 1-800-273-TALK (toll free, 24 hour hotline) -Call 911   Following is a copy of your full provider care plan:   Goals Addressed             This Visit's Progress    Goal: CCM (Depression) Expected Outcome: Monitor, Self-Manage and Reduce Symptoms of Depression       Current Barriers:  Chronic Disease Management support and education needs related to Depression  Planned Interventions: Reviewed medications and plan for management of depression. Reports not taking Cymbalta d/t decreased energy and excessive drowsiness. Prefers not to start additional medication for depression at this time. Patient reports concerns r/t her daughter and grandchildren continues to be a primary source of stress. Reports often feeling hopeless d/t inability to intervene with grandchildren. Notes grandchildren were abducted by their father and currently residing in another state.  Reviewed symptoms. Denies suicidal ideation.  Reports engaging in telephonic counseling with LCSW. Expressed interest in options for medications that will assist with weight loss. Attributes body image and excess weight to causing additional feelings of depression. Will relay concerns to PCP and discuss options.  Active Listening/Reflection Utilized Emotional support provided.  Symptom Management: Take all medications as prescribed Attend all scheduled provider appointments Call pharmacy for medication refills 3-7 days in advance of running out of medications Call provider office for new concerns or questions  If you require assistance or need to speak with  someone r/t stress: Call 911 if experiencing a Mental Health or Behavioral Health Crisis  Call the Suicide and Crisis Lifeline: 988 Call 1-800-273-TALK (toll free, 24 hour hotline)  Follow Up Plan:  Will follow up within the next month.       Goal: CCM (Hypertension) Expected Outcome: Monitor, Self-Manage and Reduce Symptoms of Hypertension       Current Barriers:  Chronic Disease Management support and education needs related to HTN  Planned Interventions: Reviewed plan for hypertension management. Currently being managed without medications.  Provided information regarding established blood pressure parameters along with indications for notifying a provider. Advised to monitor BP daily and maintain a log.  Discussed activity tolerance. Reports being unable to engage in exercise d/t joint pain. Also expressed concerns r/t balance d/t foot drop. Reviewed safety and fall prevention measures. Reports having a walker to use if needed. Discussed compliance with recommended cardiac prudent diet. Encouraged to read nutrition labels, monitor sodium intake, and avoid highly processed foods when possible. Discussed complications of uncontrolled blood pressure.  Update on 04/21/22: No changes to plan for hypertension management. Patient expressed interest in medications/injections to assist with weight loss. Feels this would assist with bp management and overall physical and mental health. Will discuss options with PCP. Reviewed s/sx of heart attack, stroke and worsening symptoms that require immediate medical attention. Reviewed pending appointments. Will follow up with PCP on 05/07/22.   Symptom Management: Take medications as prescribed   Attend all scheduled provider appointments Call pharmacy for medication refills 3-7 days in advance of running out of medications Call provider office for new concerns or questions  Check blood pressure daily Write blood pressure results in a log or  diary Call  doctor for signs and symptoms of high blood pressure Increase intake of whole grains, fruits and vegetables Read nutrition labels, monitor sodium intake, and avoid highly processed foods when possible.  Follow Up Plan: Will follow up next month          Mrs. Siefring verbalizes understanding of instructions and care plan provided today and agrees to view in MyChart.   Katha Cabal RN Care Manager/Chronic Care Management [phone]

## 2022-05-07 ENCOUNTER — Ambulatory Visit (INDEPENDENT_AMBULATORY_CARE_PROVIDER_SITE_OTHER): Payer: Medicare Other | Admitting: Family Medicine

## 2022-05-07 ENCOUNTER — Telehealth: Payer: Self-pay | Admitting: *Deleted

## 2022-05-07 ENCOUNTER — Encounter: Payer: Self-pay | Admitting: Family Medicine

## 2022-05-07 VITALS — BP 114/79 | HR 64 | Resp 16 | Ht 66.0 in | Wt 198.0 lb

## 2022-05-07 DIAGNOSIS — R739 Hyperglycemia, unspecified: Secondary | ICD-10-CM

## 2022-05-07 DIAGNOSIS — L409 Psoriasis, unspecified: Secondary | ICD-10-CM

## 2022-05-07 DIAGNOSIS — Z1321 Encounter for screening for nutritional disorder: Secondary | ICD-10-CM

## 2022-05-07 DIAGNOSIS — Z6831 Body mass index (BMI) 31.0-31.9, adult: Secondary | ICD-10-CM

## 2022-05-07 DIAGNOSIS — F32A Depression, unspecified: Secondary | ICD-10-CM | POA: Insufficient documentation

## 2022-05-07 DIAGNOSIS — L819 Disorder of pigmentation, unspecified: Secondary | ICD-10-CM | POA: Insufficient documentation

## 2022-05-07 DIAGNOSIS — E6609 Other obesity due to excess calories: Secondary | ICD-10-CM | POA: Diagnosis not present

## 2022-05-07 DIAGNOSIS — E7800 Pure hypercholesterolemia, unspecified: Secondary | ICD-10-CM

## 2022-05-07 DIAGNOSIS — F419 Anxiety disorder, unspecified: Secondary | ICD-10-CM | POA: Diagnosis not present

## 2022-05-07 DIAGNOSIS — G894 Chronic pain syndrome: Secondary | ICD-10-CM

## 2022-05-07 MED ORDER — BUPROPION HCL ER (XL) 150 MG PO TB24: 150.0000 mg | ORAL_TABLET | Freq: Every day | ORAL | 0 refills | Status: DC

## 2022-05-07 MED ORDER — PHENTERMINE HCL 37.5 MG PO CAPS: 37.5000 mg | ORAL_CAPSULE | ORAL | 0 refills | Status: DC

## 2022-05-07 NOTE — Assessment & Plan Note (Signed)
Acute on chronic, non treated Will check inflammatory marker and refer to derm for further assistance

## 2022-05-07 NOTE — Assessment & Plan Note (Signed)
Recommend screening given obesity and anxiety/depression Vit D and B12

## 2022-05-07 NOTE — Assessment & Plan Note (Signed)
Chronic, stable Body mass index is 31.96 kg/m. Request for medication to assist Will use phentermine and Wellbutrin to assist Encourage pt to call insurance for injection coverage
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
## 2022-05-07 NOTE — Assessment & Plan Note (Signed)
Hx of foot drop and back disc procedures Not improved with use of Cymbalta Concern for psoriatic arthritis
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
## 2022-05-07 NOTE — Assessment & Plan Note (Signed)
Chronic, untreated Repeat LP Goal <100 recommend diet low in saturated fat and regular exercise - 30 min at least 5 times per week

## 2022-05-07 NOTE — Assessment & Plan Note (Signed)
Chronic, inflamed L eye lid Refer to derm for assistance

## 2022-05-07 NOTE — Patient Instructions (Signed)
Call insurance re coverage for [facility] or [employer]

## 2022-05-08 NOTE — Patient Outreach (Signed)
  Care Coordination   Follow Up Visit Note   05/08/2022 Name: Suzanne [name] MRN: [id number] DOB: [birth year]  Suzanne [name] is a 66 y.o. year old female who sees Jacky Kindle, FNP for primary care. I spoke with  Suzanne [name] by phone on 05/07/22  What matters to the patients health and wellness today?  Mental Health Counseling and Support    Goals Addressed             This Visit's Progress    Needing mental health resources       Care Coordination Interventions:  Patient verbalized feeling of stress and concern related to the relationship with her daughter as well as additional family stressors Patient remains interested in outpatient therapy and would like contact information  The following resources provided: [facility] [phone] [facility] [phone] [facility] [phone] Verbalization of feelings encouraged Solution-Focused Strategies employed:  Active listening / Reflection utilized  Emotional Support Provided Discussed self-care action plan: Developing boundaries with family as well as active participation in counseling encouraged         SDOH assessments and interventions completed:  No     Care Coordination Interventions:  Yes, provided   Follow up plan: Follow up call scheduled for 05/29/22    Encounter Outcome:  Pt. Visit Completed

## 2022-05-08 NOTE — Patient Instructions (Signed)
Visit Information  Thank you for taking time to visit with me today. Please don't hesitate to contact me if I can be of assistance to you.   Following are the goals we discussed today:   Goals Addressed             This Visit's Progress    Needing mental health resources       Care Coordination Interventions:  Patient verbalized feeling of stress and concern related to the relationship with her daughter as well as additional family stressors Patient remains interested in outpatient therapy and would like contact information  The following resources provided: [facility] [phone] [facility] [phone] [facility] [phone] Verbalization of feelings encouraged Solution-Focused Strategies employed:  Active listening / Reflection utilized  Emotional Support Provided Discussed self-care action plan: Developing boundaries with family as well as active participation in counseling encouraged         Our next appointment is by telephone on 05/29/21 at 1pm  Please call the care guide team at [phone] if you need to cancel or reschedule your appointment.   If you are experiencing a Mental Health or Behavioral Health Crisis or need someone to talk to, please call the Suicide and Crisis Lifeline: 988   Patient verbalizes understanding of instructions and care plan provided today and agrees to view in MyChart. Active MyChart status and patient understanding of how to access instructions and care plan via MyChart confirmed with patient.     Telephone follow up appointment with care management team member scheduled for: 05/29/22  Verna Czech, LCSW Clinical Social Worker  [facility] Care Management [phone]

## 2022-05-12 ENCOUNTER — Other Ambulatory Visit: Payer: Self-pay | Admitting: Family Medicine

## 2022-05-12 DIAGNOSIS — E559 Vitamin D deficiency, unspecified: Secondary | ICD-10-CM | POA: Insufficient documentation

## 2022-05-12 LAB — COMPREHENSIVE METABOLIC PANEL
ALT: 27 IU/L (ref 0–32)
AST: 26 IU/L (ref 0–40)
Albumin/Globulin Ratio: 2 (ref 1.2–2.2)
Albumin: 4.5 g/dL (ref 3.9–4.9)
Alkaline Phosphatase: 78 IU/L (ref 44–121)
BUN/Creatinine Ratio: 19 (ref 12–28)
BUN: 13 mg/dL (ref 8–27)
Bilirubin Total: 0.7 mg/dL (ref 0.0–1.2)
CO2: 24 mmol/L (ref 20–29)
Calcium: 9.5 mg/dL (ref 8.7–10.3)
Chloride: 104 mmol/L (ref 96–106)
Creatinine, Ser: 0.7 mg/dL (ref 0.57–1.00)
Globulin, Total: 2.2 g/dL (ref 1.5–4.5)
Glucose: 129 mg/dL — ABNORMAL HIGH (ref 70–99)
Potassium: 4.7 mmol/L (ref 3.5–5.2)
Sodium: 143 mmol/L (ref 134–144)
Total Protein: 6.7 g/dL (ref 6.0–8.5)
eGFR: 97 mL/min/{1.73_m2} (ref >59)

## 2022-05-12 LAB — CBC
Hematocrit: 41.4 % (ref 34.0–46.6)
Hemoglobin: 14.3 g/dL (ref 11.1–15.9)
MCH: 30.7 pg (ref 26.6–33.0)
MCHC: 34.5 g/dL (ref 31.5–35.7)
MCV: 89 fL (ref 79–97)
Platelets: 249 10*3/uL (ref 150–450)
RBC: 4.66 x10E6/uL (ref 3.77–5.28)
RDW: 11.5 % — ABNORMAL LOW (ref 11.7–15.4)
WBC: 6.2 10*3/uL (ref 3.4–10.8)

## 2022-05-12 LAB — LIPID PANEL
Chol/HDL Ratio: 4 ratio (ref 0.0–4.4)
Cholesterol, Total: 232 mg/dL — ABNORMAL HIGH (ref 100–199)
HDL: 58 mg/dL (ref >39)
LDL Chol Calc (NIH): 159 mg/dL — ABNORMAL HIGH (ref 0–99)
Triglycerides: 87 mg/dL (ref 0–149)
VLDL Cholesterol Cal: 15 mg/dL (ref 5–40)

## 2022-05-12 LAB — HEMOGLOBIN A1C
Est. average glucose Bld gHb Est-mCnc: 128 mg/dL
Hgb A1c MFr Bld: 6.1 % — ABNORMAL HIGH (ref 4.8–5.6)

## 2022-05-12 LAB — SEDIMENTATION RATE: Sed Rate: 6 mm/hr (ref 0–40)

## 2022-05-12 LAB — VITAMIN B12: Vitamin B-12: 856 pg/mL (ref 232–1245)

## 2022-05-12 LAB — C-REACTIVE PROTEIN: CRP: <1 mg/L (ref 0–10)

## 2022-05-12 LAB — RHEUMATOID FACTOR: Rheumatoid fact SerPl-aCnc: <10 IU/mL (ref <14.0)

## 2022-05-12 LAB — VITAMIN D 25 HYDROXY (VIT D DEFICIENCY, FRACTURES): Vit D, 25-Hydroxy: 25.3 ng/mL — ABNORMAL LOW (ref 30.0–100.0)

## 2022-05-12 LAB — CYCLIC CITRUL PEPTIDE ANTIBODY, IGG/IGA: Cyclic Citrullin Peptide Ab: 3 units (ref 0–19)

## 2022-05-12 MED ORDER — VITAMIN D (ERGOCALCIFEROL) 1.25 MG (50000 UNIT) PO CAPS: 50000.0000 [IU] | ORAL_CAPSULE | ORAL | 0 refills | Status: DC

## 2022-05-12 NOTE — Progress Notes (Signed)
Recommend high dose vitamin D to assist; 50,000 IU weekly- will call in Rx.  Pre-diabetes remains. Continue to recommend balanced, lower carb meals. Smaller meal size, adding snacks. Choosing water as drink of choice and increasing purposeful exercise.  Cholesterol improved but elevation in bad/LDL and total cholesterol remains. The 10-year ASCVD risk score (Arnett DK, et al., 2019) is: 4.3%. Stroke and MI risk remains low at 4.3% in 10 years.    Values used to calculate the score:     Age: 66 years     Sex: Female     Is Non-Hispanic African American: No     Diabetic: No     Tobacco smoker: No     Systolic Blood Pressure: 114 mmHg     Is BP treated: No     HDL Cholesterol: 58 mg/dL     Total Cholesterol: 232 mg/dL   Inflammatory markers are all negative.

## 2022-05-29 ENCOUNTER — Ambulatory Visit: Payer: Self-pay | Admitting: *Deleted

## 2022-05-29 NOTE — Patient Outreach (Signed)
  Care Coordination   Follow Up Visit Note   05/29/2022 Name: Kinisha Soper MRN: [id number] DOB: [birth year]  Perel Hauschild is a 67 y.o. year old female who sees Gwyneth Sprout, FNP for primary care. I spoke with  Walker Shadow by phone today.  What matters to the patients health and wellness today?  Mental health support    Goals Addressed             This Visit's Progress    Needing mental health resources       Care Coordination Interventions:  Patient verbalized continued stress and concern related to the relationship with her daughter as well as additional family stressors-however states that family dynamic is beginning to improve Patient states that she is not in need of mental health follow up at this time The following resources previously provided to be utilized when needed: [place] [facility] [phone] Patient discussed having some concerns regarding feeling that something was "stuck in her throat" patient agreeable to calling her provider to schedule an appointment for follow up Verbalization of feelings encouraged Solution-Focused Strategies employed:  Active listening / Reflection utilized  Emotional Support Provided Discussed self-care action plan: Healthy boundary setting continues to be encouraged Patient verbalized having no additional needs at this time-patient encouraged to call this social worker with any additional community resource needs         SDOH assessments and interventions completed:  No     Care Coordination Interventions:  Yes, provided   Follow up plan: No further intervention required.   Encounter Outcome:  Pt. Visit Completed

## 2022-05-29 NOTE — Patient Instructions (Signed)
Visit Information  Thank you for taking time to visit with me today. Please don't hesitate to contact me if I can be of assistance to you.   Following are the goals we discussed today:   Goals Addressed             This Visit's Progress    Needing mental health resources       Care Coordination Interventions:  Patient verbalized continued stress and concern related to the relationship with her daughter as well as additional family stressors-however states that family dynamic is beginning to improve Patient states that she is not in need of mental health follow up at this time The following resources previously provided to be utilized when needed: [place] [facility] [phone] Patient discussed having some concerns regarding feeling that something was "stuck in her throat" patient agreeable to calling her provider to schedule an appointment for follow up Verbalization of feelings encouraged Solution-Focused Strategies employed:  Active listening / Reflection utilized  Emotional Support Provided Discussed self-care action plan: Healthy boundary setting continues to be encouraged Patient verbalized having no additional needs at this time-patient encouraged to call this social worker with any additional community resource needs          Please call the care guide team at [phone] if you need to cancel or reschedule your appointment.   If you are experiencing a Mental Health or [place] or need someone to talk to, please call [phone]   Patient verbalizes understanding of instructions and care plan provided today and agrees to view in [place]. Active MyChart status and patient understanding of how to access instructions and care plan via MyChart confirmed with patient.     No further follow up required: patient to call this Education officer, museum with any additional community resource needs  [employer], [place] Worker  [facility] Care Management [phone]

## 2022-06-01 ENCOUNTER — Ambulatory Visit (INDEPENDENT_AMBULATORY_CARE_PROVIDER_SITE_OTHER): Payer: Medicare Other | Admitting: Family Medicine

## 2022-06-01 ENCOUNTER — Encounter: Payer: Self-pay | Admitting: Family Medicine

## 2022-06-01 VITALS — BP 131/80 | HR 69 | Temp 97.3°F | Wt 184.0 lb

## 2022-06-01 DIAGNOSIS — E6609 Other obesity due to excess calories: Secondary | ICD-10-CM | POA: Diagnosis not present

## 2022-06-01 DIAGNOSIS — F32A Depression, unspecified: Secondary | ICD-10-CM | POA: Diagnosis not present

## 2022-06-01 DIAGNOSIS — Z6831 Body mass index (BMI) 31.0-31.9, adult: Secondary | ICD-10-CM

## 2022-06-01 DIAGNOSIS — F419 Anxiety disorder, unspecified: Secondary | ICD-10-CM | POA: Diagnosis not present

## 2022-06-01 DIAGNOSIS — R09A2 Foreign body sensation, throat: Secondary | ICD-10-CM

## 2022-06-01 MED ORDER — PHENTERMINE HCL 37.5 MG PO CAPS: 37.5000 mg | ORAL_CAPSULE | ORAL | 0 refills | Status: DC

## 2022-06-01 MED ORDER — BUPROPION HCL ER (XL) 150 MG PO TB24: 150.0000 mg | ORAL_TABLET | Freq: Every day | ORAL | 0 refills | Status: DC

## 2022-06-01 NOTE — Progress Notes (Signed)
I,[name],acting as a scribe for Gwyneth Sprout, FNP.,have documented all relevant documentation on the behalf of Gwyneth Sprout, FNP,as directed by  Gwyneth Sprout, FNP while in the presence of Gwyneth Sprout, FNP.   Established patient visit  Patient: Suzanne [name]   DOB: 01-23-1956   67 y.o. Female  MRN: [id number] Visit Date: 06/01/2022  Today's healthcare provider: Gwyneth Sprout, FNP  Re Introduced to nurse practitioner role and practice setting.  All questions answered.  Discussed provider/patient relationship and expectations.  Subjective    HPI  Patient  here with concerns of something stuck in throat. Feel like a lump.  Would like a referral to ENT.  Medications: Outpatient Medications Prior to Visit  Medication Sig   Cholecalciferol (VITAMIN D3) 50 MCG (2000 UT) CAPS Take by mouth.   gabapentin (NEURONTIN) 300 MG capsule Take 1 capsule (300 mg total) by mouth at bedtime.   [DISCONTINUED] buPROPion (WELLBUTRIN XL) 150 MG 24 hr tablet Take 1 tablet (150 mg total) by mouth daily.   [DISCONTINUED] phentermine 37.5 MG capsule Take 1 capsule (37.5 mg total) by mouth every morning.   Vitamin D, Ergocalciferol, (DRISDOL) 1.25 MG (50000 UNIT) CAPS capsule Take 1 capsule (50,000 Units total) by mouth every 7 (seven) days. (Patient not taking: Reported on 06/01/2022)   No facility-administered medications prior to visit.   Review of Systems    Objective    BP 131/80   Pulse 69   Temp (!) 97.3 F (36.3 C) (Oral)   Wt 184 lb (83.5 kg) Comment: stated  BMI 29.70 kg/m   Physical Exam Vitals and nursing note reviewed.  Constitutional:      General: She is not in acute distress.    Appearance: Normal appearance. She is obese. She is not ill-appearing, toxic-appearing or diaphoretic.  HENT:     Head: Normocephalic and atraumatic.  Cardiovascular:     Rate and Rhythm: Normal rate and regular rhythm.     Pulses: Normal pulses.     Heart sounds: Normal heart sounds.  No murmur heard.    No friction rub. No gallop.  Pulmonary:     Effort: Pulmonary effort is normal. No respiratory distress.     Breath sounds: Normal breath sounds. No stridor. No wheezing, rhonchi or rales.  Chest:     Chest wall: No tenderness.  Musculoskeletal:        General: No swelling, tenderness, deformity or signs of injury. Normal range of motion.     Right lower leg: No edema.     Left lower leg: No edema.  Skin:    General: Skin is warm and dry.     Capillary Refill: Capillary refill takes less than 2 seconds.     Coloration: Skin is not jaundiced or pale.     Findings: No bruising, erythema, lesion or rash.  Neurological:     General: No focal deficit present.     Mental Status: She is alert and oriented to person, place, and time. Mental status is at baseline.     Cranial Nerves: No cranial nerve deficit.     Sensory: No sensory deficit.     Motor: No weakness.     Coordination: Coordination normal.  Psychiatric:        Attention and Perception: Attention normal.        Mood and Affect: Mood is depressed. Affect is flat.        Speech: Speech normal.  Behavior: Behavior normal.        Thought Content: Thought content normal. Thought content does not include homicidal or suicidal ideation. Thought content does not include homicidal or suicidal plan.        Judgment: Judgment normal.    No results found for any visits on 06/01/22.  Assessment & Plan     Problem List Items Addressed This Visit       Other   Anxiety and depression    Chronic, stable Improved with wellbutrin 150 mg to assist weight mgmt Also working with LCSW      Relevant Medications   buPROPion (WELLBUTRIN XL) 150 MG 24 hr tablet   Class 1 obesity due to excess calories with serious comorbidity and body mass index (BMI) of 31.0 to 31.9 in adult    Chronic, improving Body mass index is 29.7 kg/m. Report 14# weight loss  Wishes to continue current regimen Phen and wellbutrin        Relevant Medications   buPROPion (WELLBUTRIN XL) 150 MG 24 hr tablet   phentermine 37.5 MG capsule   Globus sensation - Primary    Globus sensation; internal Denies complaints of changing drinks or food consistency  Denies unplanned weight loss Denies external feeling of "lump" Denies reflux Denies GERD or similar complaints       Relevant Orders   Ambulatory referral to ENT   Ambulatory referral to Gastroenterology   Return in about 6 months (around 11/30/2022) for annual examination.     Leilani Merl, FNP, have reviewed all documentation for this visit. The documentation on 06/01/22 for the exam, diagnosis, procedures, and orders are all accurate and complete.  Jacky Kindle, FNP  [facility] [phone] (phone) [phone] (fax)  [facility]

## 2022-06-01 NOTE — Assessment & Plan Note (Signed)
Chronic, stable Improved with wellbutrin 150 mg to assist weight mgmt Also working with [employer]

## 2022-06-01 NOTE — Assessment & Plan Note (Signed)
Chronic, improving Body mass index is 29.7 kg/m. Report 14# weight loss  Wishes to continue current regimen Phen and wellbutrin

## 2022-06-01 NOTE — Assessment & Plan Note (Signed)
Globus sensation; internal Denies complaints of changing drinks or food consistency  Denies unplanned weight loss Denies external feeling of "lump" Denies reflux Denies GERD or similar complaints

## 2022-06-11 ENCOUNTER — Encounter: Payer: Self-pay | Admitting: *Deleted

## 2022-06-11 NOTE — Telephone Encounter (Signed)
This encounter was created in error - please disregard.

## 2022-06-17 ENCOUNTER — Ambulatory Visit: Payer: Self-pay

## 2022-06-17 ENCOUNTER — Encounter: Payer: Self-pay | Admitting: Family Medicine

## 2022-06-17 NOTE — Telephone Encounter (Signed)
  Chief Complaint: SOB Symptoms: SOB at rest, having difficulty taking in deep breath, lightheadedness at times Frequency: 2 weeks  Pertinent Negatives: NA Disposition: [] ED /[] Urgent Care (no appt availability in office) / [] Appointment(In office/virtual)/ []  [place] [facility]/ [] Home Care/ [x] Refused Recommended Disposition /[] [place] [facility]/ [x]  Follow-up with PCP Additional Notes: pt states she feels she is having sx from the phentermine and unsure if related to that medication but doesn't know what else could be causing sx since she has healed from falls and had bruised ribs. Pt states she can be sitting at the desk and just craves wanting a full deep breathe. Pt didn't sound like in any distress. Recommended she schedule OV to be seen and pt refused. She didn't feel like she needed to be seen but wanted to see what Elise's recommendations were. Pt also read that phentermine and Wellbutrin are not suppose to be taken together. Tried to reach CMA but FC told to send message back. Advised pt of this and she would like FU today if possible.   Reason for Disposition  [1] MODERATE difficulty breathing (e.g., speaks in phrases, SOB even at rest, pulse 100-120) AND [2] NEW-onset or WORSE than normal  Answer Assessment - Initial Assessment Questions 1. RESPIRATORY STATUS: "Describe your breathing?" (e.g., wheezing, shortness of breath, unable to speak, severe coughing)      SOB 2. ONSET: "When did this breathing problem begin?"      2 weeks  3. PATTERN "Does the difficult breathing come and go, or has it been constant since it started?"      constant 4. SEVERITY: "How bad is your breathing?" (e.g., mild, moderate, severe)    - MILD: No SOB at rest, mild SOB with walking, speaks normally in sentences, can lie down, no retractions, pulse < 100.    - MODERATE: SOB at rest, SOB with minimal exertion and prefers to sit, cannot lie down flat, speaks in phrases, mild retractions, audible  wheezing, pulse 100-120.    - SEVERE: Very SOB at rest, speaks in single words, struggling to breathe, sitting hunched forward, retractions, pulse > 120      Moderate 7. LUNG HISTORY: "Do you have any history of lung disease?"  (e.g., pulmonary embolus, asthma, emphysema)     no 8. CAUSE: "What do you think is causing the breathing problem?"      Possibly to medication 9. OTHER SYMPTOMS: "Do you have any other symptoms? (e.g., dizziness, runny nose, cough, chest pain, fever)     Lightheadedness  Protocols used: Breathing Difficulty-A-AH

## 2022-06-26 ENCOUNTER — Ambulatory Visit
Admission: RE | Admit: 2022-06-26 | Discharge: 2022-06-26 | Disposition: A | Discharge status: Home / Self Care | Payer: Medicare Other | Source: Ambulatory Visit | Attending: Otolaryngology | Admitting: Otolaryngology

## 2022-06-26 ENCOUNTER — Other Ambulatory Visit: Payer: Self-pay | Admitting: Otolaryngology

## 2022-06-26 DIAGNOSIS — E041 Nontoxic single thyroid nodule: Secondary | ICD-10-CM

## 2022-06-26 DIAGNOSIS — R0602 Shortness of breath: Secondary | ICD-10-CM

## 2022-07-01 ENCOUNTER — Ambulatory Visit
Admission: RE | Admit: 2022-07-01 | Discharge: 2022-07-01 | Disposition: A | Discharge status: Home / Self Care | Payer: Medicare Other | Source: Ambulatory Visit | Attending: Otolaryngology | Admitting: Otolaryngology

## 2022-07-01 DIAGNOSIS — E041 Nontoxic single thyroid nodule: Secondary | ICD-10-CM

## 2022-07-03 ENCOUNTER — Other Ambulatory Visit: Payer: Self-pay | Admitting: Otolaryngology

## 2022-07-03 DIAGNOSIS — R0989 Other specified symptoms and signs involving the circulatory and respiratory systems: Secondary | ICD-10-CM

## 2022-07-03 DIAGNOSIS — R1310 Dysphagia, unspecified: Secondary | ICD-10-CM

## 2022-07-14 ENCOUNTER — Ambulatory Visit: Payer: Self-pay

## 2022-07-14 DIAGNOSIS — I10 Essential (primary) hypertension: Secondary | ICD-10-CM

## 2022-07-14 DIAGNOSIS — F32A Depression, unspecified: Secondary | ICD-10-CM

## 2022-07-15 ENCOUNTER — Ambulatory Visit: Payer: Self-pay

## 2022-07-15 DIAGNOSIS — F32A Depression, unspecified: Secondary | ICD-10-CM

## 2022-07-23 ENCOUNTER — Ambulatory Visit: Payer: Self-pay

## 2022-07-23 DIAGNOSIS — F32A Depression, unspecified: Secondary | ICD-10-CM

## 2022-07-23 NOTE — Chronic Care Management (AMB) (Signed)
Chronic Care Management   CCM RN Visit Note  07/23/2022 Name: Suzanne [name] MRN: [id number] DOB: [birth year]  Subjective: Suzanne [name] is a 67 y.o. year old female who is a primary care patient of Jacky Kindle, FNP. The patient was referred to the Chronic Care Management team for assistance with care management needs subsequent to provider initiation of CCM services and plan of care.    Today's Visit:  Engaged with patient by telephone for follow up visit.       Goals Addressed             This Visit's Progress    Goal: CCM (Depression) Expected Outcome: Monitor, Self-Manage and Reduce Symptoms of Depression       Current Barriers:  Chronic Disease Management support and education needs related to Depression  Planned Interventions: Reviewed medications and plan for management of depression.  Reports overall doing well. Reports concerns r/t her daughter and grandchildren continues to be a primary source of stress. Reports body image and excess weight were previously causing a great deal of stress. Notes significant improvement with weight. Notes primary concern/cause of stress today is related to pending imaging results. Completed and ultrasound of the thyroid and pending a Swallow test in March. Reports being very concerned regarding results.  Contacted Dr. Gregary Cromer team to request that patient be contacted. Active Listening/Reflection Utilized Emotional support provided. Update 07/23/22: Follow up regarding status of thyroid ultrasound results. Reports still being unable to speak with the provider or member of the Otolaryngology team to discuss results and treatment plan.  Reports researching options to have the thyroid growth removed. Reports reluctance regarding pending Swallow test d/t not being able to communicate with the specialty team. Aware that additional messages were left to contact her with results.  Denies urgent concerns or changes in symptoms.   Symptom  Management: Take all medications as prescribed Attend all scheduled provider appointments Call pharmacy for medication refills 3-7 days in advance of running out of medications Call provider office for new concerns or questions  If you require assistance or need to speak with someone r/t stress: Call 911 if experiencing a Mental Health or Behavioral Health Crisis  Call the Suicide and Crisis Lifeline: 988 Call 1-800-273-TALK (toll free, 24 hour hotline)        Goal: CCM (Hypertension) Expected Outcome: Monitor, Self-Manage and Reduce Symptoms of Hypertension       Current Barriers:  Chronic Disease Management support and education needs related to HTN  Planned Interventions: Reviewed plan for hypertension management'  Reviewed blood pressure parameters along with indications for notifying a provider. Advised to monitor BP daily and maintain a log.  Discussed activity tolerance. Reports some decrease in activity tolerance and occasional episodes of shortness of breath. Continues to experience joint pain and imbalance d/t foot drop. Has a walker but reports not needing to use it for several months.  Reviewed safety and fall prevention measures. Reports having a walker to use if needed. Discussed compliance with recommended cardiac prudent diet. Encouraged to read nutrition labels, monitor sodium intake, and avoid highly processed foods when possible. Reports significant improvements with weight. Remains very motivated to improve her overall health. Reviewed symptoms. Reports a few episodes of palpitations which she attributes to a medication. Reports episode resolved quickly. Denies episodes of chest pain. Reviewed s/sx of heart attack, stroke and worsening symptoms that require immediate medical attention.  Symptom Management: Take medications as prescribed   Attend all scheduled provider appointments Call  pharmacy for medication refills 3-7 days in advance of running out of  medications Call provider office for new concerns or questions  Check blood pressure daily Write blood pressure results in a log or diary Call doctor for signs and symptoms of high blood pressure Increase intake of whole grains, fruits and vegetables Read nutrition labels, monitor sodium intake, and avoid highly processed foods when possible.            PLAN: Will follow up as needed. Will provide update if a message is received from the Otololarngology team.   Katha Cabal [facility]/Chronic Care Management [phone]

## 2022-07-29 ENCOUNTER — Ambulatory Visit: Payer: Medicare Other

## 2022-09-02 DIAGNOSIS — E041 Nontoxic single thyroid nodule: Secondary | ICD-10-CM | POA: Diagnosis not present

## 2022-09-02 DIAGNOSIS — K219 Gastro-esophageal reflux disease without esophagitis: Secondary | ICD-10-CM | POA: Diagnosis not present

## 2022-09-02 DIAGNOSIS — R1314 Dysphagia, pharyngoesophageal phase: Secondary | ICD-10-CM | POA: Diagnosis not present

## 2022-09-02 DIAGNOSIS — R0602 Shortness of breath: Secondary | ICD-10-CM | POA: Diagnosis not present

## 2022-09-16 ENCOUNTER — Ambulatory Visit: Payer: Medicare Other | Admitting: Gastroenterology

## 2022-09-22 ENCOUNTER — Ambulatory Visit: Payer: Medicare Other | Admitting: Student in an Organized Health Care Education/Training Program

## 2022-09-22 ENCOUNTER — Encounter: Payer: Self-pay | Admitting: Student in an Organized Health Care Education/Training Program

## 2022-09-22 VITALS — BP 128/78 | HR 70 | Temp 97.7°F | Ht 66.0 in | Wt 165.2 lb

## 2022-09-22 DIAGNOSIS — R0602 Shortness of breath: Secondary | ICD-10-CM

## 2022-09-22 NOTE — Progress Notes (Signed)
Synopsis: Referred in for shortness of breath by Suzanne Kindle, FNP  Assessment & Plan:   1. Shortness of breath  She is presenting for the evaluation of shortness of breath in the setting of being on phenteramine and report of globus sensation in her throat. History is otherwise non-contributory, and it is unclear if the cough did contribute to the shortness of breath. We did discuss the differential for the symptoms, and we will initiate the workup with a pulmonary function test to better understand the nature of her dyspnea. This will allow [place] to assess for signs of restriction, obstruction, or central airway narrowing. Patient was also counseled regarding phenteramine, with side effects that include dyspnea, anxiety, as well as pulmonary hypertension. I have asked her to stop taking it for now while we obtain an echocardiogram to assess LV and RV function as well as PASP. I have counseled the patient that the thyroid nodule would not explain the symptoms she's describing (shortness of breath or globus sensation). Patient was asking about possibility of having the thyroid nodule resected and I counseled her against such intervention. I have recommended that she continue to follow with ENT regarding dysphagia, and with primary care and/or endocrinology regarding the thyroid nodule. Finally, should the investigation not reveal a source for the dyspnea, and she continue to have it with phenteramine held, I will consider a CT scan of her chest to complete the workup.  - ECHOCARDIOGRAM COMPLETE; Future - Pulmonary Function Test [facility] Only; Future   Return in about 3 months (around 12/22/2022).  I spent 60 minutes caring for this patient today, including preparing to see the patient, obtaining a medical history , reviewing a separately obtained history, performing a medically appropriate examination and/or evaluation, counseling and educating the patient/family/caregiver, ordering medications, tests,  or procedures, documenting clinical information in the electronic health record, and independently interpreting results (not separately reported/billed) and communicating results to the patient/family/caregiver  Suzanne Chute, MD [facility] 09/22/2022 10:12 PM    End of visit medications:  No orders of the defined types were placed in this encounter.    Current Outpatient Medications:    Cholecalciferol (VITAMIN D3) 50 MCG (2000 UT) CAPS, Take by mouth., Disp: , Rfl:    gabapentin (NEURONTIN) 300 MG capsule, Take 1 capsule (300 mg total) by mouth at bedtime., Disp: 90 capsule, Rfl: 3   phentermine 37.5 MG capsule, Take 1 capsule (37.5 mg total) by mouth every morning., Disp: 90 capsule, Rfl: 0   buPROPion (WELLBUTRIN XL) 150 MG 24 hr tablet, Take 1 tablet (150 mg total) by mouth daily. (Patient not taking: Reported on 09/22/2022), Disp: 90 tablet, Rfl: 0   Vitamin D, Ergocalciferol, (DRISDOL) 1.25 MG (50000 UNIT) CAPS capsule, Take 1 capsule (50,000 Units total) by mouth every 7 (seven) days. (Patient not taking: Reported on 06/01/2022), Disp: 26 capsule, Rfl: 0   Subjective:   PATIENT ID: Suzanne [name] GENDER: female DOB: May 07, 1956, MRN: [id number]  Chief Complaint  Patient presents with   Consult    SOB.     HPI  Suzanne [name] is a 67 year old female presenting to clinic for the evaluation of shortness of breath.  Patient reports her symptoms have been ongoing and she's been seen many times by her primary care physician as well as by ENT. On evaluation by primary care, she did report symptoms of dysphagia for which she was referred to ENT. US of the thyroid was performed and showed a  1.7 x 1.5 cm right inferior thyroid nodule, with recommendation of repeat US for follow up. She was also recommended a modified barium swallow that has not been performed yet. She was referred to pulmonary given shortness of breath.  Patient reports exertional dyspnea as well as  shortness of breath at rest. Rest of review of systems was non-revealing. No report of wheezing, chest pain, chest tightness, fevers, chills, or night sweats reported. No weight loss reported. Patient does report a cough in the setting of an URTI that appears to improved now, and almost resolved. No sputum production and no hemoptysis reported. She does report the dysphagia, but this does not seem related to shortness of breath. Patient has been followed closely by primary care for globus sensation in her throat, as well as anxiety, depression, and obesity. She was started on Phenteramine to help with weight loss, in addition to buproprion for anxiety and depression.  Patient is a lifelong non-smoker, and reports no occupational exposures. She has not had any personal history of asthma or lung disease in the past.  Ancillary information including prior medications, full medical/surgical/family/social histories, and PFTs (when available) are listed below and have been reviewed.   Review of Systems  Constitutional:  Positive for weight loss (intentional). Negative for chills, diaphoresis, fever and malaise/fatigue.  Respiratory:  Positive for shortness of breath. Negative for cough, hemoptysis, sputum production and wheezing.   Cardiovascular:  Negative for chest pain, palpitations, leg swelling and PND.  Skin:  Negative for rash.     Objective:   Vitals:   09/22/22 1128  BP: 128/78  Pulse: 70  Temp: 97.7 F (36.5 C)  SpO2: 97%  Weight: 165 lb 3.2 oz (74.9 kg)  Height: 5\' 6"  (1.676 m)   97% on RA BMI Readings from Last 3 Encounters:  09/22/22 26.66 kg/m  06/01/22 29.70 kg/m  05/07/22 31.96 kg/m   Wt Readings from Last 3 Encounters:  09/22/22 165 lb 3.2 oz (74.9 kg)  06/01/22 184 lb (83.5 kg)  05/07/22 198 lb (89.8 kg)    Physical Exam Constitutional:      Appearance: Normal appearance. She is not ill-appearing.  HENT:     Nose: Nose normal.     Mouth/Throat:     Mouth:  Mucous membranes are moist.  Cardiovascular:     Rate and Rhythm: Normal rate and regular rhythm.     Pulses: Normal pulses.     Heart sounds: Normal heart sounds.  Pulmonary:     Effort: Pulmonary effort is normal. No respiratory distress.     Breath sounds: Normal breath sounds. No stridor. No wheezing, rhonchi or rales.  Musculoskeletal:        General: Normal range of motion.     Cervical back: Normal range of motion.  Skin:    General: Skin is warm.  Neurological:     General: No focal deficit present.     Mental Status: She is alert and oriented to person, place, and time. Mental status is at baseline.       Ancillary Information    Past Medical History:  Diagnosis Date   Arthritis    Cataract    Chicken pox    Hyperlipidemia    Ulcer      Family History  Problem Relation Age of Onset   Arthritis Mother    Hyperlipidemia Mother    Hypertension Mother    Alcohol abuse Father    Arthritis Father    Hyperlipidemia Father  Hypertension Father    Diabetes Father    Breast cancer Paternal Aunt        DOSENT KNOW AGE   Breast cancer Cousin 18     Past Surgical History:  Procedure Laterality Date   EYE SURGERY     FRACTURE SURGERY     JOINT REPLACEMENT     TKR Right, 2014   SPINE SURGERY      Social History   Socioeconomic History   Marital status: Married    Spouse name: Not on file   Number of children: Not on file   Years of education: Not on file   Highest education level: Not on file  Occupational History   Not on file  Tobacco Use   Smoking status: Never   Smokeless tobacco: Never  Vaping Use   Vaping Use: Never used  Substance and Sexual Activity   Alcohol use: Yes    Comment: seldom   Drug use: No   Sexual activity: Not on file  Other Topics Concern   Not on file  Social History Narrative   Not on file   Social Determinants of Health   Financial Resource Strain: Not on file  Food Insecurity: No Food Insecurity (04/07/2022)    Hunger Vital Sign    Worried About Running Out of Food in the Last Year: Never true    Ran Out of Food in the Last Year: Never true  Transportation Needs: No Transportation Needs (04/07/2022)   PRAPARE - Administrator, Civil Service (Medical): No    Lack of Transportation (Non-Medical): No  Physical Activity: Not on file  Stress: Stress Concern Present (04/07/2022)   [employer] of Occupational Health - Occupational Stress Questionnaire    Feeling of Stress : Rather much  Social Connections: Not on file  Intimate Partner Violence: Not on file     Allergies  Allergen Reactions   Aspirin     Other reaction(s): Upset stomach     CBC    Component Value Date/Time   WBC 6.2 05/08/2022 0910   WBC 6.9 04/29/2021 1148   RBC 4.66 05/08/2022 0910   RBC 4.83 04/29/2021 1148   HGB 14.3 05/08/2022 0910   HCT 41.4 05/08/2022 0910   PLT 249 05/08/2022 0910   MCV 89 05/08/2022 0910   MCH 30.7 05/08/2022 0910   MCHC 34.5 05/08/2022 0910   MCHC 33.7 04/29/2021 1148   RDW 11.5 (L) 05/08/2022 0910   LYMPHSABS 2.3 04/29/2021 1148   LYMPHSABS 2.1 07/18/2020 0759   MONOABS 0.5 04/29/2021 1148   EOSABS 0.2 04/29/2021 1148   EOSABS 0.2 07/18/2020 0759   BASOSABS 0.1 04/29/2021 1148   BASOSABS 0.1 07/18/2020 0759    Pulmonary Functions Testing Results:     No data to display          Outpatient Medications Prior to Visit  Medication Sig Dispense Refill   Cholecalciferol (VITAMIN D3) 50 MCG (2000 UT) CAPS Take by mouth.     gabapentin (NEURONTIN) 300 MG capsule Take 1 capsule (300 mg total) by mouth at bedtime. 90 capsule 3   phentermine 37.5 MG capsule Take 1 capsule (37.5 mg total) by mouth every morning. 90 capsule 0   buPROPion (WELLBUTRIN XL) 150 MG 24 hr tablet Take 1 tablet (150 mg total) by mouth daily. (Patient not taking: Reported on 09/22/2022) 90 tablet 0   Vitamin D, Ergocalciferol, (DRISDOL) 1.25 MG (50000 UNIT) CAPS capsule Take 1 capsule (50,000  Units total) by  mouth every 7 (seven) days. (Patient not taking: Reported on 06/01/2022) 26 capsule 0   No facility-administered medications prior to visit.

## 2022-10-12 ENCOUNTER — Other Ambulatory Visit: Payer: Self-pay | Admitting: Family Medicine

## 2022-10-12 DIAGNOSIS — E6609 Other obesity due to excess calories: Secondary | ICD-10-CM

## 2022-10-13 ENCOUNTER — Encounter: Payer: Self-pay | Admitting: Family Medicine

## 2022-10-13 ENCOUNTER — Telehealth: Payer: Self-pay | Admitting: *Deleted

## 2022-10-13 ENCOUNTER — Other Ambulatory Visit: Payer: Self-pay | Admitting: Family Medicine

## 2022-10-13 ENCOUNTER — Telehealth (INDEPENDENT_AMBULATORY_CARE_PROVIDER_SITE_OTHER): Payer: Medicare Other | Admitting: Family Medicine

## 2022-10-13 ENCOUNTER — Telehealth: Payer: Self-pay | Admitting: Family Medicine

## 2022-10-13 VITALS — Wt 161.0 lb

## 2022-10-13 DIAGNOSIS — E7800 Pure hypercholesterolemia, unspecified: Secondary | ICD-10-CM

## 2022-10-13 DIAGNOSIS — E559 Vitamin D deficiency, unspecified: Secondary | ICD-10-CM

## 2022-10-13 DIAGNOSIS — E663 Overweight: Secondary | ICD-10-CM | POA: Diagnosis not present

## 2022-10-13 DIAGNOSIS — R7303 Prediabetes: Secondary | ICD-10-CM | POA: Diagnosis not present

## 2022-10-13 DIAGNOSIS — F322 Major depressive disorder, single episode, severe without psychotic features: Secondary | ICD-10-CM | POA: Diagnosis not present

## 2022-10-13 DIAGNOSIS — Z6825 Body mass index (BMI) 25.0-25.9, adult: Secondary | ICD-10-CM

## 2022-10-13 MED ORDER — PHENTERMINE HCL 15 MG PO CAPS: 15.0000 mg | ORAL_CAPSULE | ORAL | 0 refills | Status: DC

## 2022-10-13 MED ORDER — BUPROPION HCL ER (XL) 150 MG PO TB24
150.0000 mg | ORAL_TABLET | Freq: Every day | ORAL | 3 refills | Status: DC
Start: 2022-10-13 — End: 2023-05-17

## 2022-10-13 NOTE — Assessment & Plan Note (Signed)
Chronic, previously low Repeat labs On OTC supplement

## 2022-10-13 NOTE — Assessment & Plan Note (Signed)
Chronic, severe Will reconnect with CCM team and restart wellbutrin at 150 mg Encourage follow up in 6-12 weeks/as needed

## 2022-10-13 NOTE — Progress Notes (Signed)
I,Sha'taria Tyson,acting as a Neurosurgeon for Jacky Kindle, FNP.,have documented all relevant documentation on the behalf of Jacky Kindle, FNP,as directed by  Jacky Kindle, FNP while in the presence of Jacky Kindle, FNP.   MyChart Video Visit  Virtual Visit via Video Note   This format is felt to be most appropriate for this patient at this time. Physical exam was limited by quality of the video and audio technology used for the visit.   Patient location: home, kitchen Provider location: [facility]  I discussed the limitations of evaluation and management by telemedicine and the availability of in person appointments. The patient expressed understanding and agreed to proceed.  Patient: Suzanne [name]   DOB: 1956-03-16   67 y.o. Female  MRN: [id number] Visit Date: 10/13/2022  Today's healthcare provider: Jacky Kindle, FNP   Re Introduced to nurse practitioner role and practice setting.  All questions answered.  Discussed provider/patient relationship and expectations.  Subjective    HPI  -Patient is wanting to discuss phentermine. Reports she was needing a refill because they were dropped in the sink while she was out of town and they have dried into a glob.  -Patient is experiencing hip pain that she believes is inflammation that is intermittent. Reports that it is bad to the point that she can't lay on either side when laying down.     Medications: Outpatient Medications Prior to Visit  Medication Sig   Cholecalciferol (VITAMIN D3) 50 MCG (2000 UT) CAPS Take by mouth.   gabapentin (NEURONTIN) 300 MG capsule Take 1 capsule (300 mg total) by mouth at bedtime.   Vitamin D, Ergocalciferol, (DRISDOL) 1.25 MG (50000 UNIT) CAPS capsule Take 1 capsule (50,000 Units total) by mouth every 7 (seven) days. (Patient not taking: Reported on 06/01/2022)   [DISCONTINUED] buPROPion (WELLBUTRIN XL) 150 MG 24 hr tablet Take 1 tablet (150 mg total) by mouth daily. (Patient not  taking: Reported on 09/22/2022)   [DISCONTINUED] phentermine 37.5 MG capsule Take 1 capsule (37.5 mg total) by mouth every morning. (Patient not taking: Reported on 10/13/2022)   No facility-administered medications prior to visit.    Review of Systems   Objective    Wt 161 lb (73 kg)   BMI 25.99 kg/m   Physical Exam Constitutional:      Appearance: Normal appearance.  Pulmonary:     Effort: Pulmonary effort is normal.  Neurological:     General: No focal deficit present.     Mental Status: She is alert and oriented to person, place, and time.  Psychiatric:        Mood and Affect: Mood normal.        Behavior: Behavior normal.        Thought Content: Thought content normal.        Judgment: Judgment normal.      Assessment & Plan     Problem List Items Addressed This Visit       Other   Avitaminosis D    Chronic, previously low Repeat labs On OTC supplement       Relevant Orders   Vitamin D (25 hydroxy)   Depression, major, single episode, severe (HCC)    Chronic, severe Will reconnect with CCM team and restart wellbutrin at 150 mg Encourage follow up in 6-12 weeks/as needed      Elevated LDL cholesterol level    Chronic, previously elevated Repeat LP given weight loss The 10-year ASCVD risk score (Arnett  DK, et al., 2019) is: 5.3% Moderate risk- controlled with diet/exercise recommend diet low in saturated fat and regular exercise - 30 min at least 5 times per week        Relevant Orders   Lipid panel   Overweight - Primary    Chronic, improved Notes home weight of 161# Body mass index is 25.99 kg/m. Discussed importance of healthy weight management Discussed diet and exercise Wishes to continue phentermine at 1/2 dose; will send in for 15 mg       Prediabetes    Chronic, has been working on lifestyle Repeat A1c Continue to recommend balanced, lower carb meals. Smaller meal size, adding snacks. Choosing water as drink of choice and increasing  purposeful exercise.       Relevant Orders   Hemoglobin A1c   Basic Metabolic Panel (BMET)   No follow-ups on file.    I discussed the assessment and treatment plan with the patient. The patient was provided an opportunity to ask questions and all were answered. The patient agreed with the plan and demonstrated an understanding of the instructions.   The patient was advised to call back or seek an in-person evaluation if the symptoms worsen or if the condition fails to improve as anticipated.  I provided 28 minutes of face-to-face time during this encounter discussing ongoing mood concerns, HLD, avit D and obesity/overweight.  Leilani Merl, FNP, have reviewed all documentation for this visit. The documentation on 10/13/22 for the exam, diagnosis, procedures, and orders are all accurate and complete.  Jacky Kindle, FNP [facility] [phone] (phone) [phone] (fax)  [facility]

## 2022-10-13 NOTE — Assessment & Plan Note (Signed)
Chronic, improved Notes home weight of 161# Body mass index is 25.99 kg/m. Discussed importance of healthy weight management Discussed diet and exercise Wishes to continue phentermine at 1/2 dose; will send in for 15 mg

## 2022-10-13 NOTE — Assessment & Plan Note (Signed)
Chronic, has been working on lifestyle Repeat A1c Continue to recommend balanced, lower carb meals. Smaller meal size, adding snacks. Choosing water as drink of choice and increasing purposeful exercise.

## 2022-10-13 NOTE — Assessment & Plan Note (Signed)
Chronic, previously elevated Repeat LP given weight loss The 10-year ASCVD risk score (Arnett DK, et al., 2019) is: 5.3% Moderate risk- controlled with diet/exercise recommend diet low in saturated fat and regular exercise - 30 min at least 5 times per week

## 2022-10-13 NOTE — Telephone Encounter (Signed)
Medication Refill - Medication: Bupropion HCL XL 150 MG   Discussed during office visit   Has the patient contacted their pharmacy? Yes.   (Agent: If no, request that the patient contact the pharmacy for the refill. If patient does not wish to contact the pharmacy document the reason why and proceed with request.) (Agent: If yes, when and what did the pharmacy advise?)  Preferred Pharmacy (with phone number or street name):  [facility] [address] - [place], [place] - [address] [facility] - Closed AT [address]  [address] [place] [address]  Phone: [phone] Fax: [phone]   Has the patient been seen for an appointment in the last year OR does the patient have an upcoming appointment? Yes.    Agent: Please be advised that RX refills may take up to 3 business days. We ask that you follow-up with your pharmacy.

## 2022-10-13 NOTE — Progress Notes (Signed)
  Care Coordination Note  10/13/2022 Name: Brendolyn Birts MRN: [id number] DOB: 1955-05-30  Suzanne [name] is a 67 y.o. year old female who is a primary care patient of Jacky Kindle, FNP and is actively engaged with the care management team. I reached out to [facility] by phone today to assist with scheduling a follow up visit with the Licensed Clinical Social Worker  Follow up plan: Telephone appointment with care management team member scheduled for: 10/21/2022  Burman Nieves, [facility] Care Coordination Care Guide Direct Dial: [phone]

## 2022-10-14 DIAGNOSIS — E7800 Pure hypercholesterolemia, unspecified: Secondary | ICD-10-CM | POA: Diagnosis not present

## 2022-10-14 DIAGNOSIS — E559 Vitamin D deficiency, unspecified: Secondary | ICD-10-CM | POA: Diagnosis not present

## 2022-10-14 DIAGNOSIS — R7303 Prediabetes: Secondary | ICD-10-CM | POA: Diagnosis not present

## 2022-10-15 LAB — LIPID PANEL
Chol/HDL Ratio: 3.8 ratio (ref 0.0–4.4)
Cholesterol, Total: 239 mg/dL — ABNORMAL HIGH (ref 100–199)
HDL: 63 mg/dL (ref >39)
LDL Chol Calc (NIH): 164 mg/dL — ABNORMAL HIGH (ref 0–99)
Triglycerides: 73 mg/dL (ref 0–149)
VLDL Cholesterol Cal: 12 mg/dL (ref 5–40)

## 2022-10-15 LAB — BASIC METABOLIC PANEL
BUN/Creatinine Ratio: 19 (ref 12–28)
BUN: 16 mg/dL (ref 8–27)
CO2: 23 mmol/L (ref 20–29)
Calcium: 9.6 mg/dL (ref 8.7–10.3)
Chloride: 102 mmol/L (ref 96–106)
Creatinine, Ser: 0.84 mg/dL (ref 0.57–1.00)
Glucose: 113 mg/dL — ABNORMAL HIGH (ref 70–99)
Potassium: 4.7 mmol/L (ref 3.5–5.2)
Sodium: 141 mmol/L (ref 134–144)
eGFR: 78 mL/min/{1.73_m2} (ref >59)

## 2022-10-15 LAB — VITAMIN D 25 HYDROXY (VIT D DEFICIENCY, FRACTURES): Vit D, 25-Hydroxy: 42.6 ng/mL (ref 30.0–100.0)

## 2022-10-15 LAB — HEMOGLOBIN A1C
Est. average glucose Bld gHb Est-mCnc: 117 mg/dL
Hgb A1c MFr Bld: 5.7 % — ABNORMAL HIGH (ref 4.8–5.6)

## 2022-10-15 NOTE — Progress Notes (Signed)
Improved pre-diabetes average; A1c is the lowest we have seen in 5 years. Continue to work on diet and exercise; Continue to recommend balanced, lower carb meals. Smaller meal size, adding snacks. Choosing water as drink of choice and increasing purposeful exercise.  Cholesterol remains elevated; The 10-year ASCVD risk score (Arnett DK, et al., 2019) is: 5.2%. Stroke/MI risk remains moderate; unchanged from previously. I continue to recommend diet low in saturated fat and regular exercise - 30 min at least 5 times per week  Vit D is much improved. Continue to recommend 2000 IU daily OTC Vit D 3 for further bone health support.

## 2022-10-21 ENCOUNTER — Ambulatory Visit: Payer: Self-pay | Admitting: *Deleted

## 2022-10-21 NOTE — Patient Outreach (Addendum)
  Care Coordination   Follow Up Visit Note   10/21/2022 Name: Suzanne [name] MRN: [id number] DOB: 1956-03-26  Suzanne [name] is a 67 y.o. year old female who sees Suzanne Kindle, FNP for primary care. I spoke with  Suzanne [name] by phone today.  What matters to the patients health and wellness today?  Community resource support    Goals Addressed             This Visit's Progress    care coordination       Interventions Today    Flowsheet Row Most Recent Value  General Interventions   General Interventions Discussed/Reviewed General Interventions Reviewed  [Phone call to patient for follow appointment, however patient declined call with this CSW at this time. Patient reminded of previous mental resources provided in the past. Patient declined any further follow up.]              SDOH assessments and interventions completed:  No     Care Coordination Interventions:  Yes, provided   Follow up plan: No further intervention required. Patient to contact this Child psychotherapist with any additional community resource needs.  Encounter Outcome:  Pt. Visit Completed

## 2022-10-29 ENCOUNTER — Inpatient Hospital Stay: Admission: RE | Admit: 2022-10-29 | Payer: Medicare Other | Source: Ambulatory Visit

## 2022-11-03 ENCOUNTER — Other Ambulatory Visit: Payer: Self-pay | Admitting: Family Medicine

## 2022-11-03 DIAGNOSIS — Z1231 Encounter for screening mammogram for malignant neoplasm of breast: Secondary | ICD-10-CM

## 2022-12-08 ENCOUNTER — Ambulatory Visit: Payer: Medicare Other

## 2022-12-14 ENCOUNTER — Other Ambulatory Visit: Payer: Self-pay | Admitting: Family Medicine

## 2022-12-14 NOTE — Telephone Encounter (Signed)
Medication Refill - Medication: phentermine 15 MG capsule  Has the patient contacted their pharmacy? Yes.    Pt states that her PCP sent in the wrong medication to the pharmacy. Pt is going out of town tomorrow and is wanting to see if her correct medication can be sent in for her.      Preferred Pharmacy (with phone number or street name):  [facility] [address] [facility] - [place], [place] - [address] [name] AT [facility] [address] [place], [place] [address] Phone: [phone]  Fax: [phone]   Has the patient been seen for an appointment in the last year OR does the patient have an upcoming appointment? Yes.    Agent: Please be advised that RX refills may take up to 3 business days. We ask that you follow-up with your pharmacy.

## 2022-12-15 NOTE — Telephone Encounter (Signed)
Requested medications are due for refill today.  A little too soon  Requested medications are on the active medications list.  yes  Last refill 10/13/2022 #90 0 rf  Future visit scheduled.   no  Notes to clinic.  Refill/refusal not delegated.    Requested Prescriptions  Pending Prescriptions Disp Refills   phentermine 15 MG capsule 90 capsule 0    Sig: Take 1 capsule (15 mg total) by mouth every morning.     Not Delegated - Neurology: Anticonvulsants - Controlled - phentermine hydrochloride Failed - 12/14/2022  2:58 PM      Failed - This refill cannot be delegated      Passed - eGFR in normal range and within 360 days    GFR calc Af Amer  Date Value Ref Range Status  07/18/2020 106 >59 mL/min/1.73 Final    Comment:    **In accordance with recommendations from the NKF-ASN Task force,**   [facility] is in the process of updating its eGFR calculation to the   2021 CKD-EPI creatinine equation that estimates kidney function   without a race variable.    GFR calc non Af Amer  Date Value Ref Range Status  07/18/2020 92 >59 mL/min/1.73 Final   GFR  Date Value Ref Range Status  06/11/2021 89.13 >60.00 mL/min Final    Comment:    Calculated using the CKD-EPI Creatinine Equation (2021)   eGFR  Date Value Ref Range Status  10/14/2022 78 >59 mL/min/1.73 Final         Passed - Cr in normal range and within 360 days    Creatinine, Ser  Date Value Ref Range Status  10/14/2022 0.84 0.57 - 1.00 mg/dL Final         Passed - Last BP in normal range    BP Readings from Last 1 Encounters:  09/22/22 128/78         Passed - Valid encounter within last 6 months    Recent Outpatient Visits           2 months ago Overweight   [facility] Jacky Kindle, FNP   6 months ago Globus sensation   [place] [facility] Merita Norton T, FNP   7 months ago Anxiety and depression   [facility] Merita Norton T, FNP   9  months ago Depression, major, single episode, severe [facility])   [place] [facility] Merita Norton T, FNP   2 years ago Insomnia, unspecified type   [facility] Flinchum, Eula Fried, FNP              Passed - Weight completed in the last 3 months    Wt Readings from Last 1 Encounters:  10/13/22 161 lb (73 kg)

## 2022-12-24 NOTE — Chronic Care Management (AMB) (Signed)
  Chronic Care Management   CCM RN Visit Note   Name: Suzanne [name] MRN: [id number] DOB: 01-24-1956  Subjective: Suzanne [name] is a 67 y.o. year old female who is a primary care patient of Jacky Kindle, FNP. The patient was referred to the Chronic Care Management team for assistance with care management needs subsequent to provider initiation of CCM services and plan of care.    Today's Visit: Follow up call to the Otolaryngology team.       Goals Addressed             This Visit's Progress    Goal: CCM (Depression) Expected Outcome: Monitor, Self-Manage and Reduce Symptoms of Depression       Current Barriers:  Chronic Disease Management support and education needs related to Depression  Planned Interventions: Reviewed medications and plan for management of depression.  Reports overall doing well. Reports concerns r/t her daughter and grandchildren continues to be a primary source of stress. Reports body image and excess weight were previously causing a great deal of stress. Notes significant improvement with weight. Notes primary concern/cause of stress today is related to pending imaging results. Completed and ultrasound of the thyroid and pending a Swallow test in March. Reports being very concerned regarding results.  Contacted Dr. Gregary Cromer team to request that patient be contacted. Active Listening/Reflection Utilized Emotional support provided. Update 07/15/22: Follow up call to the Otolaryngology team regarding patient's request for an update/phone call to discuss results of the thyroid ultrasound.    Symptom Management: Take all medications as prescribed Attend all scheduled provider appointments Call pharmacy for medication refills 3-7 days in advance of running out of medications Call provider office for new concerns or questions  If you require assistance or need to speak with someone r/t stress: Call 911 if experiencing a Mental Health or Behavioral  Health Crisis  Call the Suicide and Crisis Lifeline: 988 Call 1-800-273-TALK (toll free, 24 hour hotline)           PLAN: Will follow up later this month   [facility]/Chronic Care Management [phone]

## 2022-12-24 NOTE — Chronic Care Management (AMB) (Signed)
Chronic Care Management   CCM RN Visit Note   Name: Suzanne [name] MRN: [id number] DOB: 1956-01-20  Subjective: Suzanne [name] is a 67 y.o. year old female who is a primary care patient of Jacky Kindle, FNP. The patient was referred to the Chronic Care Management team for assistance with care management needs subsequent to provider initiation of CCM services and plan of care.    Today's Visit:  Engaged with patient by telephone for follow up visit.        Goals Addressed             This Visit's Progress    Goal: CCM (Depression) Expected Outcome: Monitor, Self-Manage and Reduce Symptoms of Depression       Current Barriers:  Chronic Disease Management support and education needs related to Depression  Planned Interventions: Reviewed medications and plan for management of depression.  Reports overall doing well. Reports concerns r/t her daughter and grandchildren continues to be a primary source of stress. Reports body image and excess weight were previously causing a great deal of stress. Notes significant improvement with weight. Notes primary concern/cause of stress today is related to pending imaging results. Completed and ultrasound of the thyroid and pending a Swallow test in March. Reports being very concerned regarding results.  Contacted Dr. Gregary Cromer team to request that patient be contacted. Active Listening/Reflection Utilized Emotional support provided.  Symptom Management: Take all medications as prescribed Attend all scheduled provider appointments Call pharmacy for medication refills 3-7 days in advance of running out of medications Call provider office for new concerns or questions  If you require assistance or need to speak with someone r/t stress: Call 911 if experiencing a Mental Health or Behavioral Health Crisis  Call the Suicide and Crisis Lifeline: 988 Call 1-800-273-TALK (toll free, 24 hour hotline)        Goal: CCM (Hypertension)  Expected Outcome: Monitor, Self-Manage and Reduce Symptoms of Hypertension       Current Barriers:  Chronic Disease Management support and education needs related to HTN  Planned Interventions: Reviewed plan for hypertension management'  Reviewed blood pressure parameters along with indications for notifying a provider. Advised to monitor BP daily and maintain a log.  Discussed activity tolerance. Reports some decrease in activity tolerance and occasional episodes of shortness of breath. Continues to experience joint pain and imbalance d/t foot drop. Has a walker but reports not needing to use it for several months.  Reviewed safety and fall prevention measures. Reports having a walker to use if needed. Discussed compliance with recommended cardiac prudent diet. Encouraged to read nutrition labels, monitor sodium intake, and avoid highly processed foods when possible. Reports significant improvements with weight. Remains very motivated to improve her overall health. Reviewed symptoms. Reports a few episodes of palpitations which she attributes to a medication. Reports episode resolved quickly. Denies episodes of chest pain. Reviewed s/sx of heart attack, stroke and worsening symptoms that require immediate medical attention.  Symptom Management: Take medications as prescribed   Attend all scheduled provider appointments Call pharmacy for medication refills 3-7 days in advance of running out of medications Call provider office for new concerns or questions  Check blood pressure daily Write blood pressure results in a log or diary Call doctor for signs and symptoms of high blood pressure Increase intake of whole grains, fruits and vegetables Read nutrition labels, monitor sodium intake, and avoid highly processed foods when possible.          PLAN:  A member of the care management team will follow up within the next week.   [facility]/Chronic Care  Management [phone]

## 2022-12-31 ENCOUNTER — Other Ambulatory Visit: Payer: Medicare Other

## 2022-12-31 ENCOUNTER — Ambulatory Visit: Payer: Medicare Other | Admitting: Student in an Organized Health Care Education/Training Program

## 2023-01-07 ENCOUNTER — Other Ambulatory Visit: Payer: Self-pay

## 2023-01-07 NOTE — Patient Outreach (Signed)
  Care Management    01/07/2023 Name: [name] MRN: [id number] DOB: 01-May-1956  Subjective: Suzanne [name] is a 67 y.o. year old female who is a primary care patient of Jacky Kindle, FNP. The Care Management team was consulted for assistance.       A unsuccessful follow-up outreach was attempted today. Unable to leave a voice message due to patient's voice mailbox being full.  PLAN  Will attempt follow up outreach within the next week.   [facility]/Care Management [phone]

## 2023-01-14 DIAGNOSIS — S92912A Unspecified fracture of left toe(s), initial encounter for closed fracture: Secondary | ICD-10-CM | POA: Diagnosis not present

## 2023-01-14 DIAGNOSIS — M47812 Spondylosis without myelopathy or radiculopathy, cervical region: Secondary | ICD-10-CM | POA: Diagnosis not present

## 2023-01-14 DIAGNOSIS — S92512A Displaced fracture of proximal phalanx of left lesser toe(s), initial encounter for closed fracture: Secondary | ICD-10-CM | POA: Insufficient documentation

## 2023-01-14 DIAGNOSIS — M79672 Pain in left foot: Secondary | ICD-10-CM | POA: Diagnosis not present

## 2023-01-15 ENCOUNTER — Ambulatory Visit
Admission: RE | Admit: 2023-01-15 | Discharge: 2023-01-15 | Disposition: A | Discharge status: Home / Self Care | Payer: Medicare Other | Source: Ambulatory Visit | Attending: Family Medicine | Admitting: Family Medicine

## 2023-01-15 DIAGNOSIS — Z1231 Encounter for screening mammogram for malignant neoplasm of breast: Secondary | ICD-10-CM | POA: Diagnosis not present

## 2023-01-19 ENCOUNTER — Other Ambulatory Visit: Payer: Self-pay | Admitting: Family Medicine

## 2023-01-19 DIAGNOSIS — R928 Other abnormal and inconclusive findings on diagnostic imaging of breast: Secondary | ICD-10-CM

## 2023-01-27 ENCOUNTER — Ambulatory Visit
Admission: RE | Admit: 2023-01-27 | Discharge: 2023-01-27 | Disposition: A | Discharge status: Home / Self Care | Payer: Medicare Other | Source: Ambulatory Visit | Attending: Family Medicine | Admitting: Family Medicine

## 2023-01-27 ENCOUNTER — Ambulatory Visit: Admission: RE | Admit: 2023-01-27 | Payer: Medicare Other | Source: Ambulatory Visit

## 2023-01-27 DIAGNOSIS — R928 Other abnormal and inconclusive findings on diagnostic imaging of breast: Secondary | ICD-10-CM

## 2023-05-10 ENCOUNTER — Ambulatory Visit: Payer: Self-pay | Admitting: *Deleted

## 2023-05-10 ENCOUNTER — Ambulatory Visit (INDEPENDENT_AMBULATORY_CARE_PROVIDER_SITE_OTHER): Payer: Medicare Other | Admitting: Family Medicine

## 2023-05-10 ENCOUNTER — Telehealth: Payer: Self-pay

## 2023-05-10 ENCOUNTER — Encounter: Payer: Self-pay | Admitting: Family Medicine

## 2023-05-10 VITALS — BP 137/61 | HR 59 | Resp 16 | Wt 177.8 lb

## 2023-05-10 DIAGNOSIS — M25551 Pain in right hip: Secondary | ICD-10-CM | POA: Diagnosis not present

## 2023-05-10 DIAGNOSIS — M79604 Pain in right leg: Secondary | ICD-10-CM

## 2023-05-10 NOTE — Telephone Encounter (Signed)
Copied from CRM [id number]. Topic: General - Other >> May 10, 2023 12:04 PM Turkey B wrote: Reason for CRM: pt called in says doesn't want to come to the 3pm appt doay, unless and ultrasound is gonna be scheduled and done, to see if she has a blood clot

## 2023-05-10 NOTE — Patient Instructions (Signed)
Please review the attached list of medications and notify my office if there are any errors.   Go to [facility] at [facility] for your hip X-rays (phone no. [phone])

## 2023-05-10 NOTE — Telephone Encounter (Signed)
  Chief Complaint: top right hip pain last night. Hx right foot drop dec. Sensation right leg.  Symptoms: denies pain now. Reports extreme pain like stabbing right top hip area/ groin x 1/2 hour middle of the night. No swelling no redness no pain now. Can walk on right leg.  Frequency: last night  Pertinent Negatives: Patient denies chest pain no difficulty breathing no redness no fever no swelling right leg or hip.  Disposition: [] ED /[] Urgent Care (no appt availability in office) / [x] Appointment(In office/virtual)/ []  [place] [facility]/ [] Home Care/ [] Refused Recommended Disposition /[] [place] [facility]/ []  Follow-up with PCP Additional Notes:   Patient concerned of blood clot. Patient reports she is going out of town tomorrow and concerned of what has caused episode of pain . No available appt with PCP until tomorrow. Scheduled today with another provider in office. Please advise if patient needs to go to ED. Recommended if sx worsen go to ED.    Summary: Pt has foot drop and feels she had a blood clot in her right leg   Pt stated that she has an appt on 05/17/23 but she is pretty sure that last night she had an episode of a blood clot at the top of the hip of her right leg. Pt stated that she has foot drop and she wants to make provider aware. Cb# [phone]         Reason for Disposition  Numbness in a leg or foot (i.e., loss of sensation)  Answer Assessment - Initial Assessment Questions 1. ONSET: "When did the pain start?"      Middle of night last night  2. LOCATION: "Where is the pain located?"      Top of right hip/ groin area 3. PAIN: "How bad is the pain?"    (Scale 1-10; or mild, moderate, severe)   -  MILD (1-3): doesn't interfere with normal activities    -  MODERATE (4-7): interferes with normal activities (e.g., work or school) or awakens from sleep, limping    -  SEVERE (8-10): excruciating pain, unable to do any normal activities, unable to walk     No  pain now . Was extreme pain last night approx 1/2 hour 4. WORK OR EXERCISE: "Has there been any recent work or exercise that involved this part of the body?"      na 5. CAUSE: "What do you think is causing the leg pain?"     Not sure possible "blood clot" 6. OTHER SYMPTOMS: "Do you have any other symptoms?" (e.g., chest pain, back pain, breathing difficulty, swelling, rash, fever, numbness, weakness)     Hx right ft drop. Decreased sensation lower right leg. No pain now in top of right hip area, no swelling no redness 7. PREGNANCY: "Is there any chance you are pregnant?" "When was your last menstrual period?"     na  Protocols used: Leg Pain-A-AH

## 2023-05-10 NOTE — Progress Notes (Signed)
Established patient visit   Patient: Suzanne [name]   DOB: 12/09/1955   67 y.o. Female  MRN: [id number] Visit Date: 05/10/2023  Today's healthcare provider: Mila Merry, MD   Chief Complaint  Patient presents with   Hip Pain   Subjective    Discussed the use of AI scribe software for clinical note transcription with the patient, who gave verbal consent to proceed.  History of Present Illness   The patient, with a history of foot drop since 2009, presented with a new onset of sharp, pulsating pain in the right lateral hip area. The pain began suddenly in the middle of the night, lasting for about half an hour with episodes of intense pain lasting for approximately thirty seconds, followed by periods of relief. The pain was localized to a specific tender area on the hip, with no radiation to other areas. The patient denied any recent injuries, swelling, or similar episodes in the past.  The patient also reported persistent numbness in the leg, extending from the foot to the knee, with minimal sensation remaining on the sides of the big and pinky toes. The patient has had a history of foot drop since 2009, with no significant changes recently.  In response to the pain, the patient applied a Tylenol rub and a heating pad, which provided some relief. The patient considered going to the emergency room due to concerns of a possible blood clot, but decided against it as the pain eventually subsided.      Medications: Outpatient Medications Prior to Visit  Medication Sig   buPROPion (WELLBUTRIN XL) 150 MG 24 hr tablet Take 1 tablet (150 mg total) by mouth daily.   Cholecalciferol (VITAMIN D3) 50 MCG (2000 UT) CAPS Take by mouth.   gabapentin (NEURONTIN) 300 MG capsule Take 1 capsule (300 mg total) by mouth at bedtime.   phentermine 15 MG capsule Take 1 capsule (15 mg total) by mouth every morning.   Vitamin D, Ergocalciferol, (DRISDOL) 1.25 MG (50000 UNIT) CAPS capsule Take 1  capsule (50,000 Units total) by mouth every 7 (seven) days.   No facility-administered medications prior to visit.   (optional):1}   Objective    BP 137/61 (BP Location: Left Arm, Patient Position: Sitting, Cuff Size: Large)   Pulse (!) 59   Resp 16   Wt 177 lb 12.8 oz (80.6 kg)   BMI 28.70 kg/m    Physical Exam   EXTREMITIES: Left leg smaller than right leg. Decreased sensation in left leg, numbness extending to knee. Sensation of pressure in back of calf. MUSCULOSKELETAL: Left foot drop present since 2009. Minimal nerve sensation on lateral aspect of left big toe and right side near pinky, middle toes mostly numb with no toenail growth. Point tenderness right iliac crest NEUROLOGICAL: Decreased sensation in left leg, numbness extending to knee. Sensation of pressure in back of calf. Minimal nerve sensation on lateral aspect of left big toe and right side near pinky, middle toes mostly numb. SKIN: No edema in legs.    No results found for any visits on 05/10/23.  Assessment & Plan        Acute Hip Pain and leg pain Sudden onset of sharp, pulsating pain in the right lateral hip region, lasting for about 30 minutes and then resolved. No recent injuries or swelling. Pain localized to a specific area on the hip. No current pain. -Order hip X-ray to assess for any bone abnormalities. -Order D-dimer blood test to rule  out blood clot, given the patient's travel plans. -If D-dimer is elevated, advise patient to cancel travel plans and further investigate.         Mila Merry, MD  [facility] [phone] (phone) [phone] (fax)  [facility]

## 2023-05-11 LAB — D-DIMER, QUANTITATIVE: D-DIMER: 0.33 mg{FEU}/L (ref 0.00–0.49)

## 2023-05-17 ENCOUNTER — Ambulatory Visit (INDEPENDENT_AMBULATORY_CARE_PROVIDER_SITE_OTHER): Payer: Medicare Other | Admitting: Family Medicine

## 2023-05-17 VITALS — BP 131/79 | HR 72 | Ht 66.0 in | Wt 175.0 lb

## 2023-05-17 DIAGNOSIS — E782 Mixed hyperlipidemia: Secondary | ICD-10-CM | POA: Insufficient documentation

## 2023-05-17 DIAGNOSIS — I10 Essential (primary) hypertension: Secondary | ICD-10-CM | POA: Diagnosis not present

## 2023-05-17 DIAGNOSIS — Z599 Problem related to housing and economic circumstances, unspecified: Secondary | ICD-10-CM

## 2023-05-17 DIAGNOSIS — R7303 Prediabetes: Secondary | ICD-10-CM | POA: Diagnosis not present

## 2023-05-17 DIAGNOSIS — F339 Major depressive disorder, recurrent, unspecified: Secondary | ICD-10-CM

## 2023-05-17 DIAGNOSIS — E663 Overweight: Secondary | ICD-10-CM

## 2023-05-17 DIAGNOSIS — G2581 Restless legs syndrome: Secondary | ICD-10-CM | POA: Diagnosis not present

## 2023-05-17 DIAGNOSIS — G894 Chronic pain syndrome: Secondary | ICD-10-CM

## 2023-05-17 DIAGNOSIS — M25551 Pain in right hip: Secondary | ICD-10-CM | POA: Diagnosis not present

## 2023-05-17 DIAGNOSIS — S92515S Nondisplaced fracture of proximal phalanx of left lesser toe(s), sequela: Secondary | ICD-10-CM

## 2023-05-17 DIAGNOSIS — Z6828 Body mass index (BMI) 28.0-28.9, adult: Secondary | ICD-10-CM

## 2023-05-17 MED ORDER — GABAPENTIN 300 MG PO CAPS: 300.0000 mg | ORAL_CAPSULE | Freq: Every day | ORAL | 3 refills | Status: DC

## 2023-05-17 MED ORDER — PHENTERMINE HCL 37.5 MG PO CAPS: 37.5000 mg | ORAL_CAPSULE | ORAL | 0 refills | Status: DC

## 2023-05-17 MED ORDER — LISINOPRIL-HYDROCHLOROTHIAZIDE 10-12.5 MG PO TABS: 1.0000 | ORAL_TABLET | Freq: Every day | ORAL | 3 refills | Status: DC

## 2023-05-17 MED ORDER — BUPROPION HCL ER (XL) 150 MG PO TB24: 150.0000 mg | ORAL_TABLET | Freq: Every day | ORAL | 3 refills | Status: DC

## 2023-05-17 MED ORDER — QUETIAPINE FUMARATE ER 150 MG PO TB24: 150.0000 mg | ORAL_TABLET | Freq: Every day | ORAL | 2 refills | Status: DC

## 2023-05-17 NOTE — Patient Instructions (Signed)
Dr Payton Mccallum follow up  -L foot -R hip  New start of BP, Zestoretic and seroquel for mood/sleep

## 2023-05-17 NOTE — Progress Notes (Signed)
Established patient visit   Patient: Suzanne [name]   DOB: Nov 21, 1955   67 y.o. Female  MRN: [id number] Visit Date: 05/17/2023  Today's healthcare provider: Jacky Kindle, FNP  Introduced to nurse practitioner role and practice setting.  All questions answered.  Discussed provider/patient relationship and expectations.  Chief Complaint  Patient presents with   Hip Pain    Pt stated--Right hip---possible blood clot,sharp/stabbing pain lasted 1.5 hr since last OV w/ Dr. Sherrie Mustache.   Subjective    Hip Pain    HPI     Hip Pain    Additional comments: Pt stated--Right hip---possible blood clot,sharp/stabbing pain lasted 1.5 hr since last OV w/ Dr. Sherrie Mustache.      Last edited by Shelly Bombard, CMA on 05/17/2023 10:43 AM.      Medications: Outpatient Medications Prior to Visit  Medication Sig   Cholecalciferol (VITAMIN D3) 50 MCG (2000 UT) CAPS Take by mouth.   Vitamin D, Ergocalciferol, (DRISDOL) 1.25 MG (50000 UNIT) CAPS capsule Take 1 capsule (50,000 Units total) by mouth every 7 (seven) days.   [DISCONTINUED] buPROPion (WELLBUTRIN XL) 150 MG 24 hr tablet Take 1 tablet (150 mg total) by mouth daily.   [DISCONTINUED] gabapentin (NEURONTIN) 300 MG capsule Take 1 capsule (300 mg total) by mouth at bedtime.   [DISCONTINUED] phentermine 15 MG capsule Take 1 capsule (15 mg total) by mouth every morning.   No facility-administered medications prior to visit.   Last CBC Lab Results  Component Value Date   WBC 5.0 05/17/2023   HGB 14.6 05/17/2023   HCT 43.2 05/17/2023   MCV 90 05/17/2023   MCH 30.5 05/17/2023   RDW 11.4 (L) 05/17/2023   PLT 286 05/17/2023   Last metabolic panel Lab Results  Component Value Date   GLUCOSE 87 05/17/2023   NA 142 05/17/2023   K 4.6 05/17/2023   CL 104 05/17/2023   CO2 24 05/17/2023   BUN 15 05/17/2023   CREATININE 0.81 05/17/2023   EGFR 81 05/17/2023   CALCIUM 9.5 05/17/2023   PROT 7.1 05/17/2023   ALBUMIN 4.6 05/17/2023    LABGLOB 2.5 05/17/2023   AGRATIO 2.0 05/08/2022   BILITOT 0.7 05/17/2023   ALKPHOS 95 05/17/2023   AST 20 05/17/2023   ALT 16 05/17/2023   Last lipids Lab Results  Component Value Date   CHOL 235 (H) 05/17/2023   HDL 73 05/17/2023   LDLCALC 148 (H) 05/17/2023   TRIG 83 05/17/2023   CHOLHDL 3.2 05/17/2023   Last hemoglobin A1c Lab Results  Component Value Date   HGBA1C 5.7 (H) 05/17/2023   Last thyroid functions Lab Results  Component Value Date   TSH 2.260 05/17/2023   Last vitamin D Lab Results  Component Value Date   VD25OH 31.2 05/17/2023   Last vitamin B12 and Folate Lab Results  Component Value Date   VITAMINB12 856 05/08/2022     Objective    BP 131/79 (BP Location: Right Arm, Patient Position: Sitting, Cuff Size: Normal)   Pulse 72   Ht 5\' 6"  (1.676 m)   Wt 175 lb (79.4 kg)   SpO2 97%   BMI 28.25 kg/m  BP Readings from Last 3 Encounters:  05/17/23 131/79  05/10/23 137/61  09/22/22 128/78   Wt Readings from Last 3 Encounters:  05/17/23 175 lb (79.4 kg)  05/10/23 177 lb 12.8 oz (80.6 kg)  10/13/22 161 lb (73 kg)   SpO2 Readings from Last 3 Encounters:  05/17/23 97%  09/22/22 97%  05/07/22 100%   Physical Exam Vitals and nursing note reviewed.  Constitutional:      General: She is not in acute distress.    Appearance: Normal appearance. She is overweight. She is not ill-appearing, toxic-appearing or diaphoretic.  HENT:     Head: Normocephalic and atraumatic.  Cardiovascular:     Rate and Rhythm: Normal rate and regular rhythm.     Pulses: Normal pulses.     Heart sounds: Normal heart sounds. No murmur heard.    No friction rub. No gallop.  Pulmonary:     Effort: Pulmonary effort is normal. No respiratory distress.     Breath sounds: Normal breath sounds. No stridor. No wheezing, rhonchi or rales.  Chest:     Chest wall: No tenderness.  Musculoskeletal:        General: Tenderness and signs of injury present. No swelling or deformity.  Normal range of motion.     Right lower leg: No edema.     Left lower leg: No edema.     Comments: L Plantar foot pain following recent fracture; seen by ortho. R Acute hip pain; recurrent  Woke her out of sleep Sudden; R hip at crest Lased 1/2 hour Sharp, stabbing characteristics Use of heat pack to assist with resolution Previously negative d-dimer; low risk for DVT. Hx of recently flights 3 hour on flight each way; 1 week ago   Skin:    General: Skin is warm and dry.     Capillary Refill: Capillary refill takes less than 2 seconds.     Coloration: Skin is not jaundiced or pale.     Findings: No bruising, erythema, lesion or rash.  Neurological:     General: No focal deficit present.     Mental Status: She is alert and oriented to person, place, and time. Mental status is at baseline.     Cranial Nerves: No cranial nerve deficit.     Sensory: No sensory deficit.     Motor: No weakness.     Coordination: Coordination normal.  Psychiatric:        Mood and Affect: Mood normal.        Behavior: Behavior normal.        Thought Content: Thought content normal.        Judgment: Judgment normal.     Results for orders placed or performed in visit on 05/17/23  Comprehensive Metabolic Panel (CMET)  Result Value Ref Range   Glucose 87 70 - 99 mg/dL   BUN 15 8 - 27 mg/dL   Creatinine, Ser 6.96 0.57 - 1.00 mg/dL   eGFR 81 >29 BM/WUX/3.24   BUN/Creatinine Ratio 19 12 - 28   Sodium 142 134 - 144 mmol/L   Potassium 4.6 3.5 - 5.2 mmol/L   Chloride 104 96 - 106 mmol/L   CO2 24 20 - 29 mmol/L   Calcium 9.5 8.7 - 10.3 mg/dL   Total Protein 7.1 6.0 - 8.5 g/dL   Albumin 4.6 3.9 - 4.9 g/dL   Globulin, Total 2.5 1.5 - 4.5 g/dL   Bilirubin Total 0.7 0.0 - 1.2 mg/dL   Alkaline Phosphatase 95 44 - 121 IU/L   AST 20 0 - 40 IU/L   ALT 16 0 - 32 IU/L  CBC with Differential/Platelet  Result Value Ref Range   WBC 5.0 3.4 - 10.8 x10E3/uL   RBC 4.79 3.77 - 5.28 x10E6/uL   Hemoglobin 14.6 11.1  - 15.9 g/dL  Hematocrit 43.2 34.0 - 46.6 %   MCV 90 79 - 97 fL   MCH 30.5 26.6 - 33.0 pg   MCHC 33.8 31.5 - 35.7 g/dL   RDW 60.4 (L) 54.0 - 98.1 %   Platelets 286 150 - 450 x10E3/uL   Neutrophils 49 Not Estab. %   Lymphs 39 Not Estab. %   Monocytes 9 Not Estab. %   Eos 2 Not Estab. %   Basos 1 Not Estab. %   Neutrophils Absolute 2.4 1.4 - 7.0 x10E3/uL   Lymphocytes Absolute 2.0 0.7 - 3.1 x10E3/uL   Monocytes Absolute 0.4 0.1 - 0.9 x10E3/uL   EOS (ABSOLUTE) 0.1 0.0 - 0.4 x10E3/uL   Basophils Absolute 0.1 0.0 - 0.2 x10E3/uL   Immature Granulocytes 0 Not Estab. %   Immature Grans (Abs) 0.0 0.0 - 0.1 x10E3/uL  Lipid panel  Result Value Ref Range   Cholesterol, Total 235 (H) 100 - 199 mg/dL   Triglycerides 83 0 - 149 mg/dL   HDL 73 >19 mg/dL   VLDL Cholesterol Cal 14 5 - 40 mg/dL   LDL Chol Calc (NIH) 147 (H) 0 - 99 mg/dL   Chol/HDL Ratio 3.2 0.0 - 4.4 ratio  Lipoprotein A (LPA)  Result Value Ref Range   Lipoprotein (a) 8.5 <75.0 nmol/L  Vitamin D (25 hydroxy)  Result Value Ref Range   Vit D, 25-Hydroxy 31.2 30.0 - 100.0 ng/mL  Hemoglobin A1c  Result Value Ref Range   Hgb A1c MFr Bld 5.7 (H) 4.8 - 5.6 %   Est. average glucose Bld gHb Est-mCnc 117 mg/dL  TSH  Result Value Ref Range   TSH 2.260 0.450 - 4.500 uIU/mL    Assessment & Plan     Problem List Items Addressed This Visit       Cardiovascular and Mediastinum   Essential hypertension   Chronic, borderline Recommend treatment if wishes to continue use of phentermine to assist with weight mgmt Start Zestoretic to assist Goal remains 119/79 or less      Relevant Medications   lisinopril-hydrochlorothiazide (ZESTORETIC) 10-12.5 MG tablet   Other Relevant Orders   Comprehensive Metabolic Panel (CMET) (Completed)   CBC with Differential/Platelet (Completed)   TSH (Completed)     Musculoskeletal and Integument   Closed fracture of proximal phalanx of lesser toe of left foot   Chronic plantar pain; recommend  use of supportive socks as well as corn pad vs insole to assist 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 location and character Continue to monitor        Other   Chronic pain syndrome - Primary   Chronic, was previously on gaba at bedtime to assist; has since stopped Recommend restart to assist with recurrent flare of R hip pain and acute L foot pain       Relevant Medications   buPROPion (WELLBUTRIN XL) 150 MG 24 hr tablet   gabapentin (NEURONTIN) 300 MG capsule   Other Relevant Orders   Comprehensive Metabolic Panel (CMET) (Completed)   CBC with Differential/Platelet (Completed)   Lipid panel (Completed)   Lipoprotein A (LPA) (Completed)   Vitamin D (25 hydroxy) (Completed)   TSH (Completed)   Depression, recurrent (HCC)   Acute on chronic, many external and relationship factors impacting overall mood and wellbeing Denies SI or HI Verbally contracted for safety    06/01/2022    3:05 PM 05/07/2022   10:44 AM 04/07/2022    2:15 PM  PHQ9 SCORE ONLY  PHQ-9 Total Score  4 11 14        No data to display                Relevant Medications   buPROPion (WELLBUTRIN XL) 150 MG 24 hr tablet   Financial difficulties   Acute on chronic, mental and drug concerns in daughter. Estranged from her spouse and son, despite same residence. Ongoing mood concerns in addition to financial concerns. Ctm; declines referral to [facility] team at this time      Mixed hyperlipidemia   Chronic, The 10-year ASCVD risk score (Arnett DK, et al., 2019) is: 7.5% LDL goal remains <100 I continue to recommend diet low in saturated fat and regular exercise - 30 min at least 5 times per week Consider start of statin as ASCVD increases      Relevant Medications   lisinopril-hydrochlorothiazide (ZESTORETIC) 10-12.5 MG tablet   Other Relevant Orders   Lipid panel (Completed)   Lipoprotein A (LPA) (Completed)   Overweight with body mass index (BMI) of 28 to 28.9 in adult   Chronic, improved Wishes to  lose another 20-25# Request to restart phentermine to assist Continue to monitor      Prediabetes   Chronic, repeat A1c Continue to recommend balanced, lower carb meals. Smaller meal size, adding snacks. Choosing water as drink of choice and increasing purposeful exercise. Goal to prevent prediabetes transition to t2DM      Relevant Orders   Hemoglobin A1c (Completed)   Restless leg syndrome   Acute on chronic, recurrent Recommend restart of gaba at 300 at bedtime to assist Continue to monitor      Relevant Medications   gabapentin (NEURONTIN) 300 MG capsule   Right hip pain   Acute, recurrent Self limiting Wakes pt from sleep; however, lasts 30 hr with ability to resolve with heat pack Previous workup included negative d dimer Bilateral calves stable without edema, pain, erythema Low risk for dvt despite resent travel Recommend OTC use of ASA 325 daily to assist for next week while we restart additional agents      Relevant Orders   Comprehensive Metabolic Panel (CMET) (Completed)   CBC with Differential/Platelet (Completed)   Lipid panel (Completed)   Lipoprotein A (LPA) (Completed)   Vitamin D (25 hydroxy) (Completed)   TSH (Completed)   Return in about 6 weeks (around 06/28/2023) for HTN management, anxiety and depression.     Leilani Merl, FNP, have reviewed all documentation for this visit. The documentation on 05/22/23 for the exam, diagnosis, procedures, and orders are all accurate and complete.  Jacky Kindle, FNP  [facility] [phone] (phone) [phone] (fax)  [facility]

## 2023-05-20 LAB — CBC WITH DIFFERENTIAL/PLATELET
Basophils Absolute: 0.1 10*3/uL (ref 0.0–0.2)
Basos: 1 %
EOS (ABSOLUTE): 0.1 10*3/uL (ref 0.0–0.4)
Eos: 2 %
Hematocrit: 43.2 % (ref 34.0–46.6)
Hemoglobin: 14.6 g/dL (ref 11.1–15.9)
Immature Grans (Abs): 0 10*3/uL (ref 0.0–0.1)
Immature Granulocytes: 0 %
Lymphocytes Absolute: 2 10*3/uL (ref 0.7–3.1)
Lymphs: 39 %
MCH: 30.5 pg (ref 26.6–33.0)
MCHC: 33.8 g/dL (ref 31.5–35.7)
MCV: 90 fL (ref 79–97)
Monocytes Absolute: 0.4 10*3/uL (ref 0.1–0.9)
Monocytes: 9 %
Neutrophils Absolute: 2.4 10*3/uL (ref 1.4–7.0)
Neutrophils: 49 %
Platelets: 286 10*3/uL (ref 150–450)
RBC: 4.79 x10E6/uL (ref 3.77–5.28)
RDW: 11.4 % — ABNORMAL LOW (ref 11.7–15.4)
WBC: 5 10*3/uL (ref 3.4–10.8)

## 2023-05-20 LAB — VITAMIN D 25 HYDROXY (VIT D DEFICIENCY, FRACTURES): Vit D, 25-Hydroxy: 31.2 ng/mL (ref 30.0–100.0)

## 2023-05-20 LAB — LIPID PANEL
Chol/HDL Ratio: 3.2 {ratio} (ref 0.0–4.4)
Cholesterol, Total: 235 mg/dL — ABNORMAL HIGH (ref 100–199)
HDL: 73 mg/dL (ref >39)
LDL Chol Calc (NIH): 148 mg/dL — ABNORMAL HIGH (ref 0–99)
Triglycerides: 83 mg/dL (ref 0–149)
VLDL Cholesterol Cal: 14 mg/dL (ref 5–40)

## 2023-05-20 LAB — COMPREHENSIVE METABOLIC PANEL
ALT: 16 [IU]/L (ref 0–32)
AST: 20 [IU]/L (ref 0–40)
Albumin: 4.6 g/dL (ref 3.9–4.9)
Alkaline Phosphatase: 95 [IU]/L (ref 44–121)
BUN/Creatinine Ratio: 19 (ref 12–28)
BUN: 15 mg/dL (ref 8–27)
Bilirubin Total: 0.7 mg/dL (ref 0.0–1.2)
CO2: 24 mmol/L (ref 20–29)
Calcium: 9.5 mg/dL (ref 8.7–10.3)
Chloride: 104 mmol/L (ref 96–106)
Creatinine, Ser: 0.81 mg/dL (ref 0.57–1.00)
Globulin, Total: 2.5 g/dL (ref 1.5–4.5)
Glucose: 87 mg/dL (ref 70–99)
Potassium: 4.6 mmol/L (ref 3.5–5.2)
Sodium: 142 mmol/L (ref 134–144)
Total Protein: 7.1 g/dL (ref 6.0–8.5)
eGFR: 81 mL/min/{1.73_m2} (ref >59)

## 2023-05-20 LAB — HEMOGLOBIN A1C
Est. average glucose Bld gHb Est-mCnc: 117 mg/dL
Hgb A1c MFr Bld: 5.7 % — ABNORMAL HIGH (ref 4.8–5.6)

## 2023-05-20 LAB — TSH: TSH: 2.26 u[IU]/mL (ref 0.450–4.500)

## 2023-05-20 LAB — LIPOPROTEIN A (LPA): Lipoprotein (a): 8.5 nmol/L (ref <75.0)

## 2023-05-22 ENCOUNTER — Encounter: Payer: Self-pay | Admitting: Family Medicine

## 2023-05-22 DIAGNOSIS — Z599 Problem related to housing and economic circumstances, unspecified: Secondary | ICD-10-CM | POA: Insufficient documentation

## 2023-05-22 DIAGNOSIS — I10 Essential (primary) hypertension: Secondary | ICD-10-CM | POA: Insufficient documentation

## 2023-05-22 DIAGNOSIS — F339 Major depressive disorder, recurrent, unspecified: Secondary | ICD-10-CM | POA: Insufficient documentation

## 2023-05-22 NOTE — Assessment & Plan Note (Signed)
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

## 2023-05-22 NOTE — Assessment & Plan Note (Signed)
Chronic, was previously on gaba at bedtime to assist; has since stopped Recommend restart to assist with recurrent flare of R hip pain and acute L foot pain

## 2023-05-22 NOTE — Assessment & Plan Note (Signed)
Acute on chronic, mental and drug concerns in daughter. Estranged from her spouse and son, despite same residence. Ongoing mood concerns in addition to financial concerns. Ctm; declines referral to [facility] team at this time

## 2023-05-22 NOTE — Assessment & Plan Note (Signed)
Acute on chronic, many external and relationship factors impacting overall mood and wellbeing Denies SI or HI Verbally contracted for safety    06/01/2022    3:05 PM 05/07/2022   10:44 AM 04/07/2022    2:15 PM  PHQ9 SCORE ONLY  PHQ-9 Total Score 4 11 14        No data to display

## 2023-05-22 NOTE — Assessment & Plan Note (Signed)
Chronic, borderline Recommend treatment if wishes to continue use of phentermine to assist with weight mgmt Start Zestoretic to assist Goal remains 119/79 or less

## 2023-05-22 NOTE — Assessment & Plan Note (Signed)
Chronic, repeat A1c Continue to recommend balanced, lower carb meals. Smaller meal size, adding snacks. Choosing water as drink of choice and increasing purposeful exercise. Goal to prevent prediabetes transition to t2DM

## 2023-05-22 NOTE — Assessment & Plan Note (Signed)
Acute on chronic, recurrent Recommend restart of gaba at 300 at bedtime to assist Continue to monitor

## 2023-05-22 NOTE — Assessment & Plan Note (Signed)
Chronic, The 10-year ASCVD risk score (Arnett DK, et al., 2019) is: 7.5% LDL goal remains <100 I continue to recommend diet low in saturated fat and regular exercise - 30 min at least 5 times per week Consider start of statin as ASCVD increases

## 2023-05-22 NOTE — Assessment & Plan Note (Signed)
Acute, recurrent Self limiting Wakes pt from sleep; however, lasts 30 hr with ability to resolve with heat pack Previous workup included negative d dimer Bilateral calves stable without edema, pain, erythema Low risk for dvt despite resent travel Recommend OTC use of ASA 325 daily to assist for next week while we restart additional agents

## 2023-05-22 NOTE — Assessment & Plan Note (Signed)
Chronic, improved Wishes to lose another 20-25# Request to restart phentermine to assist Continue to monitor

## 2023-05-24 MED ORDER — ROSUVASTATIN CALCIUM 10 MG PO TABS: 10.0000 mg | ORAL_TABLET | Freq: Every day | ORAL | 3 refills | Status: DC

## 2023-05-25 ENCOUNTER — Other Ambulatory Visit: Payer: Self-pay | Admitting: Family Medicine

## 2023-06-17 DIAGNOSIS — H524 Presbyopia: Secondary | ICD-10-CM | POA: Diagnosis not present

## 2023-06-17 DIAGNOSIS — Z961 Presence of intraocular lens: Secondary | ICD-10-CM | POA: Diagnosis not present

## 2023-09-23 IMAGING — US US ABDOMEN LIMITED
1 series · 14 of 25 positions shown · non-contrast
Comparison: None.

CLINICAL DATA: Elevated LFT

EXAM:
ULTRASOUND ABDOMEN LIMITED RIGHT UPPER QUADRANT

[Series 1: us abdomen limited · 0.23mm/px · 14 of 45 slices shown]
[im 1/45]
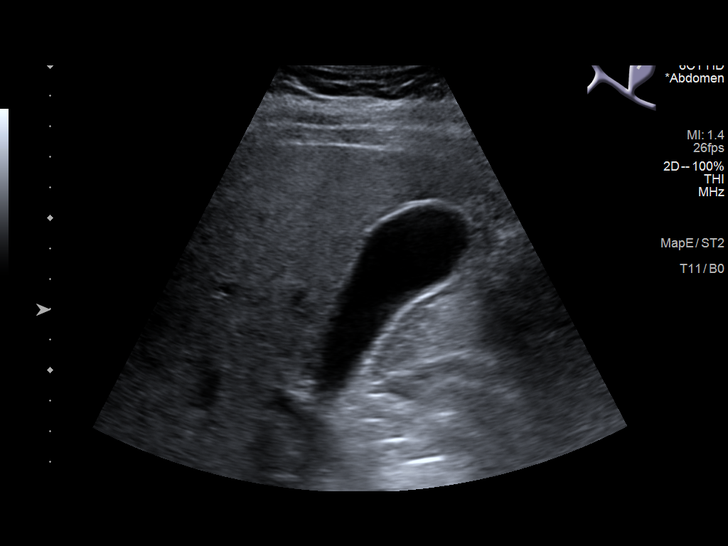
[im 4/45]
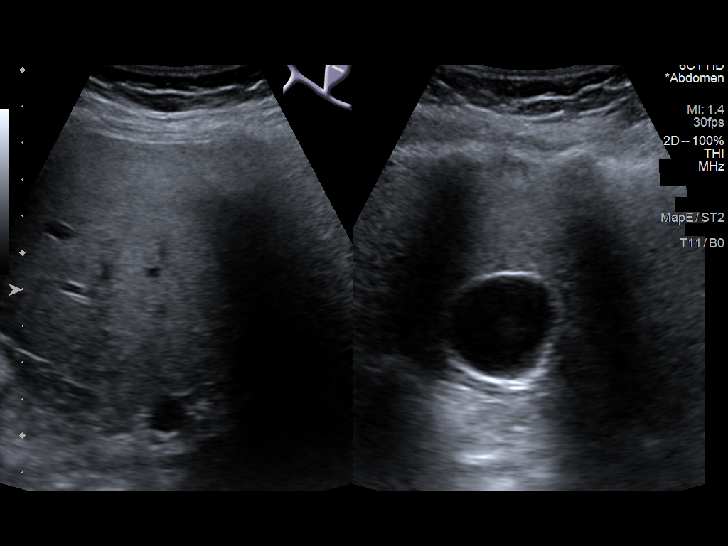
[im 8/45]
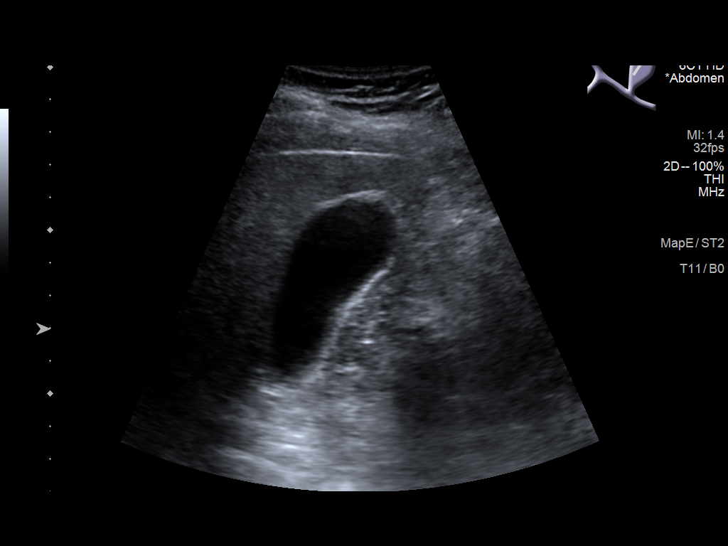
[im 12/45]
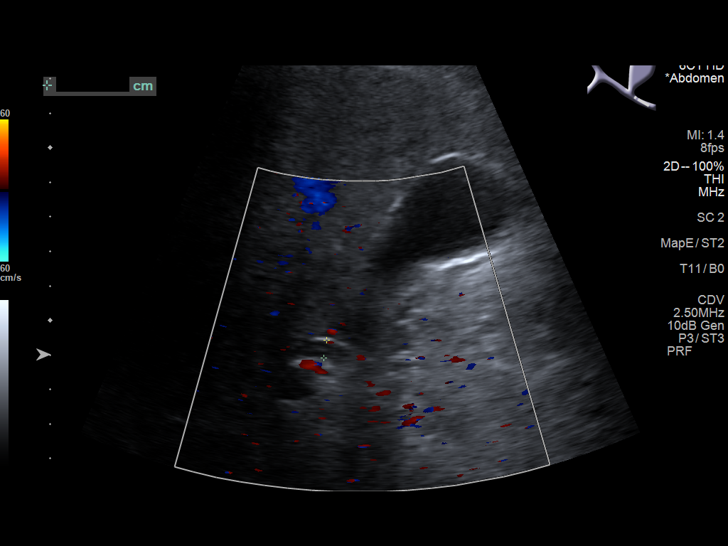
[im 15/45]
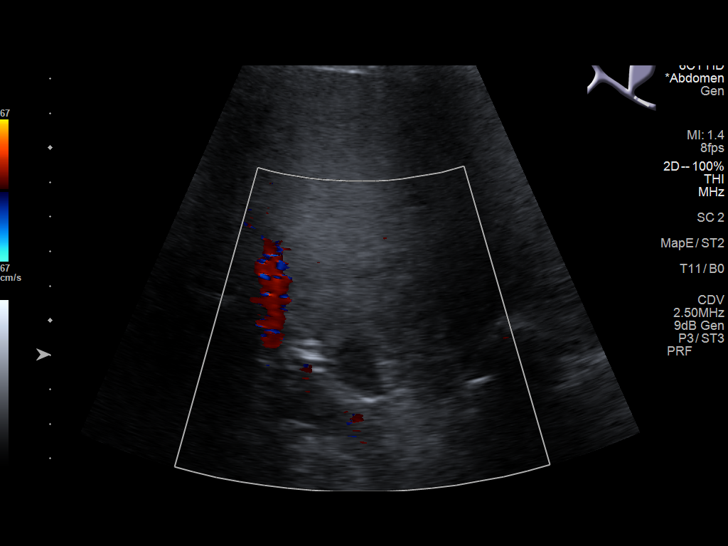
[im 17/45]
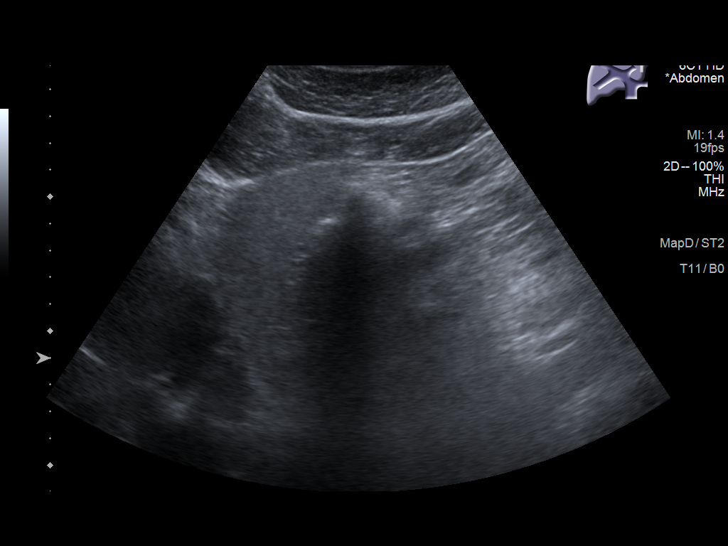
[im 21/45]
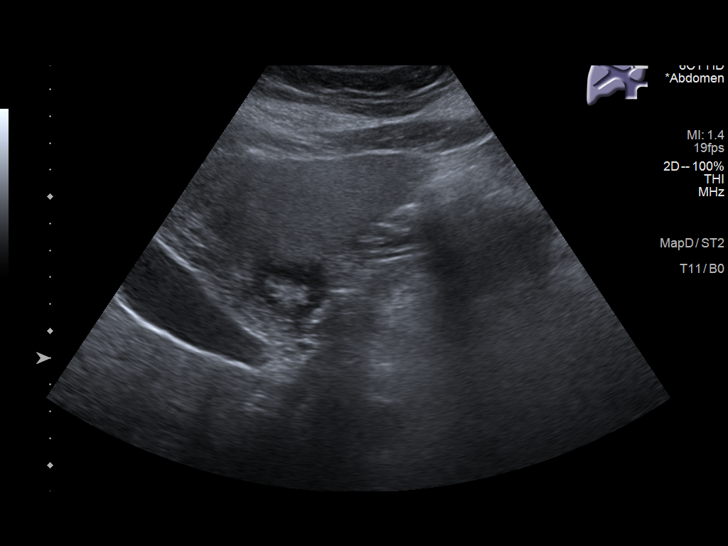
[im 24/45]
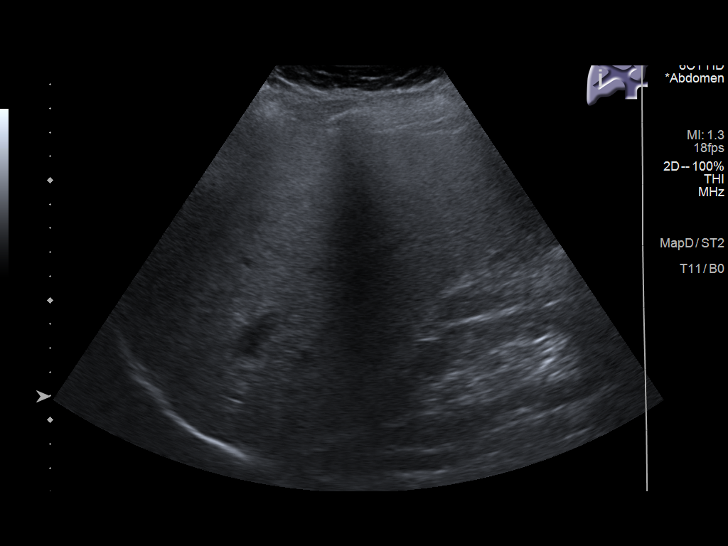
[im 28/45]
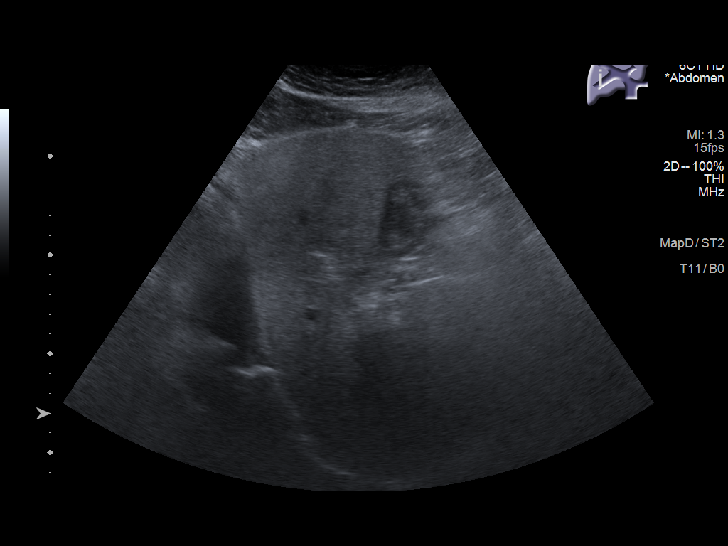
[im 30/45]
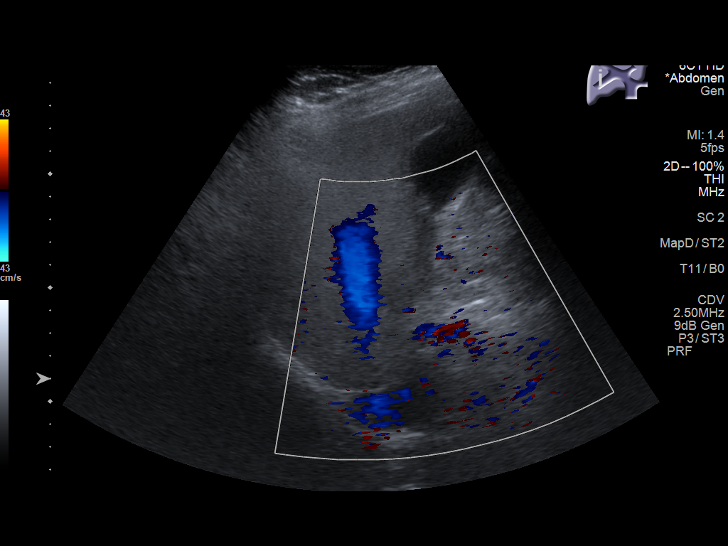
[im 34/45]
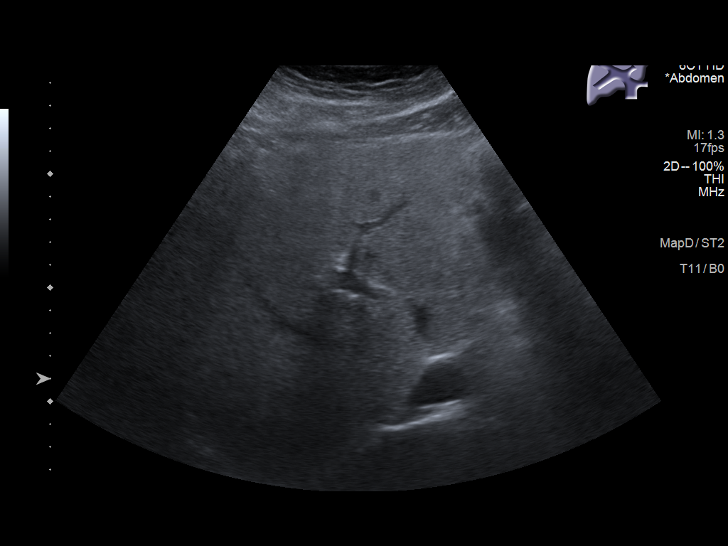
[im 37/45]
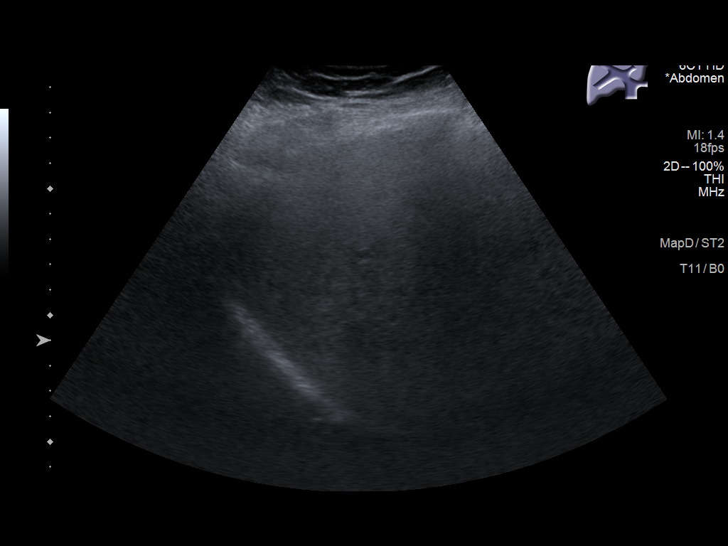
[im 41/45]
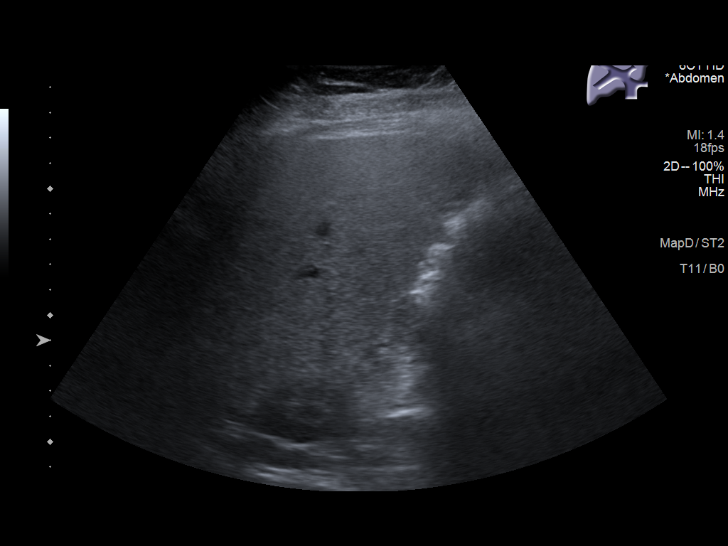
[im 45/45]
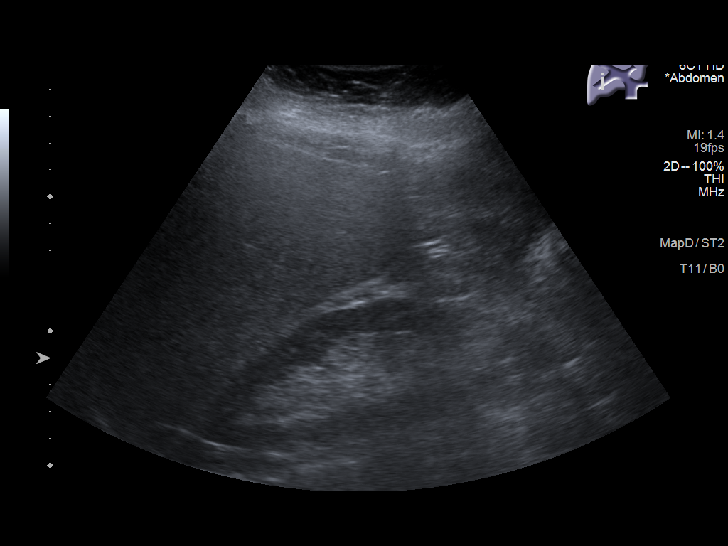

[14 of 25 positions shown; findings below may reference images not displayed]

FINDINGS: Gallbladder:

No gallstones or wall thickening visualized. No sonographic Murphy
sign noted by sonographer.

Common bile duct:

Diameter: 5.2 mm

Liver:

Diffusely echogenic. No focal hepatic abnormality. Portal vein is
patent on color Doppler imaging with normal direction of blood flow
towards the liver.

Other: None.
IMPRESSION: 1. Negative for gallstones.
2. Diffusely echogenic liver consistent with hepatic steatosis and
or hepatocellular disease

## 2023-12-06 ENCOUNTER — Ambulatory Visit: Payer: Self-pay

## 2023-12-06 NOTE — Telephone Encounter (Signed)
   Pt is having fluid from both ears. She is scheduled with Dr. Marcine on 12/08/2023. Pt want advice on what to do for now until appt. Please call pt at [phone].    First attempt; left vm

## 2023-12-06 NOTE — Telephone Encounter (Signed)
2nd attempt to call patient no answer left voicemail

## 2023-12-06 NOTE — Telephone Encounter (Signed)
 2nd attempt to call patient: no answer:left voicemail Third attempt made; no answer; forwarded to office.

## 2023-12-08 ENCOUNTER — Ambulatory Visit (INDEPENDENT_AMBULATORY_CARE_PROVIDER_SITE_OTHER): Admitting: Family Medicine

## 2023-12-08 ENCOUNTER — Encounter: Payer: Self-pay | Admitting: Family Medicine

## 2023-12-08 VITALS — BP 150/59 | HR 61 | Temp 98.2°F | Ht 66.0 in | Wt 175.0 lb

## 2023-12-08 DIAGNOSIS — G894 Chronic pain syndrome: Secondary | ICD-10-CM | POA: Diagnosis not present

## 2023-12-08 DIAGNOSIS — H60313 Diffuse otitis externa, bilateral: Secondary | ICD-10-CM | POA: Diagnosis not present

## 2023-12-08 DIAGNOSIS — G2581 Restless legs syndrome: Secondary | ICD-10-CM

## 2023-12-08 DIAGNOSIS — H9213 Otorrhea, bilateral: Secondary | ICD-10-CM | POA: Diagnosis not present

## 2023-12-08 DIAGNOSIS — L989 Disorder of the skin and subcutaneous tissue, unspecified: Secondary | ICD-10-CM

## 2023-12-08 MED ORDER — TRAZODONE HCL 50 MG PO TABS: 25.0000 mg | ORAL_TABLET | Freq: Every evening | ORAL | 2 refills | Status: DC | PRN

## 2023-12-08 MED ORDER — AMOXICILLIN-POT CLAVULANATE 875-125 MG PO TABS: 1.0000 | ORAL_TABLET | Freq: Two times a day (BID) | ORAL | 0 refills | Status: AC

## 2023-12-08 MED ORDER — GABAPENTIN 300 MG PO CAPS: 300.0000 mg | ORAL_CAPSULE | Freq: Every day | ORAL | 3 refills | Status: AC
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
## 2024-01-17 ENCOUNTER — Encounter: Payer: Self-pay | Admitting: Physician Assistant

## 2024-01-17 ENCOUNTER — Ambulatory Visit (INDEPENDENT_AMBULATORY_CARE_PROVIDER_SITE_OTHER): Admitting: Physician Assistant

## 2024-01-17 VITALS — BP 137/67 | HR 58 | Temp 97.7°F | Ht 66.0 in | Wt 188.7 lb

## 2024-01-17 DIAGNOSIS — H9213 Otorrhea, bilateral: Secondary | ICD-10-CM

## 2024-01-17 DIAGNOSIS — E66811 Obesity, class 1: Secondary | ICD-10-CM

## 2024-01-17 DIAGNOSIS — Z6831 Body mass index (BMI) 31.0-31.9, adult: Secondary | ICD-10-CM

## 2024-01-17 DIAGNOSIS — L299 Pruritus, unspecified: Secondary | ICD-10-CM | POA: Diagnosis not present

## 2024-01-17 DIAGNOSIS — E6609 Other obesity due to excess calories: Secondary | ICD-10-CM

## 2024-01-17 DIAGNOSIS — F32A Depression, unspecified: Secondary | ICD-10-CM

## 2024-01-17 DIAGNOSIS — F419 Anxiety disorder, unspecified: Secondary | ICD-10-CM | POA: Diagnosis not present

## 2024-01-17 DIAGNOSIS — G894 Chronic pain syndrome: Secondary | ICD-10-CM

## 2024-01-17 DIAGNOSIS — J3089 Other allergic rhinitis: Secondary | ICD-10-CM

## 2024-01-17 DIAGNOSIS — L409 Psoriasis, unspecified: Secondary | ICD-10-CM

## 2024-01-17 DIAGNOSIS — Z7689 Persons encountering health services in other specified circumstances: Secondary | ICD-10-CM | POA: Insufficient documentation

## 2024-01-17 MED ORDER — LEVOCETIRIZINE DIHYDROCHLORIDE 5 MG PO TABS: 5.0000 mg | ORAL_TABLET | Freq: Every evening | ORAL | 2 refills | Status: DC

## 2024-01-17 MED ORDER — FLUTICASONE PROPIONATE 50 MCG/ACT NA SUSP: 2.0000 | Freq: Every day | NASAL | 6 refills | Status: DC
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
## 2024-02-15 ENCOUNTER — Ambulatory Visit: Admitting: Physician Assistant

## 2024-02-17 NOTE — Progress Notes (Signed)
 Established patient visit  Patient: Suzanne [name]   DOB: 06/28/1955   68 y.o. Female  MRN: [id number] Visit Date: 02/18/2024  Today's healthcare provider: Jolynn Spencer, PA-C   Chief Complaint  Patient presents with   Follow-up    Pt has seen ENT and was prescribed steroid drops and cleared up ear. Doing well.   Subjective     HPI     Follow-up    Additional comments: Pt has seen ENT and was prescribed steroid drops and cleared up ear. Doing well.      Last edited by Wilfred Hargis RAMAN, CMA on 02/18/2024  8:27 AM.       Discussed the use of AI scribe software for clinical note transcription with the patient, who gave verbal consent to proceed.  History of Present Illness Suzanne [name] is a 68 year old female who presents with ear discharge and weight gain.  She experiences bilateral ear discharge, which she attributes to Q-tip use. Steroid ear drops prescribed by an ENT specialist initially caused burning. She also uses Xyzal  and Flonase .  She has gained 28 pounds recently, despite previously losing 42 pounds with assistance. She is concerned about her weight gain and is interested in a nutrition program and exercise regimen. Foot drop and multiple artificial joints limit her ability to exercise. She experiences chronic joint pain and lumbar spine issues.  She has anxiety and depression and is scheduled for counseling with [facility]. She is reading a book to manage her mental health.  She is prediabetic and cautious about her diet. She feels isolated in her marriage, having been away from her husband for two weeks and recently returned, feeling lonely and unsupported.       06/01/2022    3:05 PM 05/07/2022   10:44 AM 04/07/2022    2:15 PM  Depression screen PHQ 2/9  Decreased Interest 0 0 1  Down, Depressed, Hopeless 1 2 1   PHQ - 2 Score 1 2 2   Altered sleeping 3 3 3   Tired, decreased energy 0 3 2  Change in appetite 0 3 3  Feeling bad or  failure about yourself  0 0 2  Trouble concentrating 0 0 2  Moving slowly or fidgety/restless 0 0 0  Suicidal thoughts 0 0 0  PHQ-9 Score 4 11 14   Difficult doing work/chores Not difficult at all Somewhat difficult Somewhat difficult       No data to display          Medications: Outpatient Medications Prior to Visit  Medication Sig   gabapentin  (NEURONTIN ) 300 MG capsule Take 1 capsule (300 mg total) by mouth at bedtime.   traZODone  (DESYREL ) 50 MG tablet Take 0.5-1 tablets (25-50 mg total) by mouth at bedtime as needed for sleep.   [DISCONTINUED] buPROPion  (WELLBUTRIN  XL) 150 MG 24 hr tablet Take 1 tablet (150 mg total) by mouth daily. (Patient not taking: Reported on 01/17/2024)   [DISCONTINUED] Cholecalciferol (VITAMIN D3) 50 MCG (2000 UT) CAPS Take by mouth.   [DISCONTINUED] fluticasone  (FLONASE ) 50 MCG/ACT nasal spray Place 2 sprays into both nostrils daily.   [DISCONTINUED] levocetirizine (XYZAL ) 5 MG tablet Take 1 tablet (5 mg total) by mouth every evening.   [DISCONTINUED] lisinopril -hydrochlorothiazide  (ZESTORETIC ) 10-12.5 MG tablet Take 1 tablet by mouth daily. (Patient not taking: Reported on 01/17/2024)   [DISCONTINUED] QUEtiapine  Fumarate (SEROQUEL  XR) 150 MG 24 hr tablet Take 1 tablet (150 mg total) by mouth at bedtime. (Patient not taking: Reported on 01/17/2024)   [  DISCONTINUED] rosuvastatin  (CRESTOR ) 10 MG tablet Take 1 tablet (10 mg total) by mouth daily. (Patient not taking: Reported on 01/17/2024)   [DISCONTINUED] Vitamin D , Ergocalciferol , (DRISDOL ) 1.25 MG (50000 UNIT) CAPS capsule Take 1 capsule (50,000 Units total) by mouth every 7 (seven) days.   No facility-administered medications prior to visit.    Review of Systems All negative Except see HPI       Objective    BP 126/75   Pulse (!) 57   Resp 14   Ht 5' 6 (1.676 m)   Wt 192 lb (87.1 kg)   SpO2 97%   BMI 30.99 kg/m     Physical Exam Vitals reviewed.  Constitutional:      General: She is  not in acute distress.    Appearance: Normal appearance. She is well-developed. She is not diaphoretic.  HENT:     Head: Normocephalic and atraumatic.  Eyes:     General: No scleral icterus.    Conjunctiva/sclera: Conjunctivae normal.  Neck:     Thyroid : No thyromegaly.  Cardiovascular:     Rate and Rhythm: Normal rate and regular rhythm.     Pulses: Normal pulses.     Heart sounds: Normal heart sounds. No murmur heard. Pulmonary:     Effort: Pulmonary effort is normal. No respiratory distress.     Breath sounds: Normal breath sounds. No wheezing, rhonchi or rales.  Musculoskeletal:     Cervical back: Neck supple.     Right lower leg: No edema.     Left lower leg: No edema.  Lymphadenopathy:     Cervical: No cervical adenopathy.  Skin:    General: Skin is warm and dry.     Findings: No rash.  Neurological:     Mental Status: She is alert and oriented to person, place, and time. Mental status is at baseline.  Psychiatric:        Mood and Affect: Mood normal.        Behavior: Behavior normal.      No results found for any visits on 02/18/24.      Assessment & Plan Obesity with abnormal weight gain Chronic and stable Body mass index is 30.99 kg/m. Obesity with 28-pound weight gain, regaining previously lost 42 pounds. Exercise limited by foot drop and multiple artificial joints. - Enroll in official nutrition program for 3 to 6 months. - Explore alternative exercise options: swimming, yoga, Pilates. - Reassess medication options after 3 to 6 months of lifestyle modifications. 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 - Explore alternative exercise options: swimming, yoga, Pilates. Follow-up with spine specialist  Anxiety and depression Chronic and stable    06/01/2022    3:05 PM 05/07/2022   10:44 AM 04/07/2022    2:15 PM   PHQ9 SCORE ONLY  PHQ-9 Total Score 4  11  14       Data saved with a previous flowsheet row definition       No data to display          Anxiety and depression with counseling appointment on October 4th. Reports improvement with self-help strategies. - Attend counseling appointment on October 4th. - Continue self-help strategies and reading. Will follow-up  Psoriasis Chronic Stable Psoriasis with potential ear involvement. Steroid eardrops improved ear condition. Will follow-up  Hearing loss with prior ear inflammation Resolved Hearing loss with prior  ear inflammation. ENT evaluation indicated irritation from Q-tip use. Steroid eardrops resolved inflammation. - Avoid Q-tip use in ears.   Non-seasonal allergic rhinitis due to other allergic trigger - Use nasal saline rinses before nose sprays such as with Neilmed Sinus Rinse bottle.  Use distilled water.   - Use Flonase  2 sprays each nostril daily. Aim upward and outward. - Use Zyrtec 10 mg daily.    Psoriasis Chronic and stable Consider dermatology if symptoms persist  Class 1 obesity due to excess calories with serious comorbidity and body mass index (BMI) of 31.0 to 31.9 in adult - Amb ref to Medical Nutrition Therapy-MNT  Prediabetes Chronic and stable A1c 5.7 Low carb and daily exercise advised Will follow-up  Mixed hyperlipidemia Chronic and previous;ly unstable The 10-year ASCVD risk score (Arnett DK, et al., 2019) is: 5.2% Low cholesterol and daily exercise advised Will follow-up    Financial difficulties Reported by pt   Orders Placed This Encounter  Procedures   Amb ref to Medical Nutrition Therapy-MNT    Referral Priority:   Routine    Referral Type:   Consultation    Referral Reason:   Specialty Services Required    Requested Specialty:   Nutrition    Number of Visits Requested:   1    Return in about 2 months (around 04/19/2024) for chronic disease f/u.   The patient was advised to  call back or seek an in-person evaluation if the symptoms worsen or if the condition fails to improve as anticipated.  I discussed the assessment and treatment plan with the patient. The patient was provided an opportunity to ask questions and all were answered. The patient agreed with the plan and demonstrated an understanding of the instructions.  I, Timera Windt, PA-C have reviewed all documentation for this visit. The documentation on 02/18/2024  for the exam, diagnosis, procedures, and orders are all accurate and complete.  Jolynn Spencer, [facility], MMS [facility] [phone] (phone) [phone] (fax)  [facility]

## 2024-02-18 ENCOUNTER — Ambulatory Visit (INDEPENDENT_AMBULATORY_CARE_PROVIDER_SITE_OTHER): Admitting: Physician Assistant

## 2024-02-18 ENCOUNTER — Encounter: Payer: Self-pay | Admitting: Physician Assistant

## 2024-02-18 VITALS — BP 126/75 | HR 57 | Resp 14 | Ht 66.0 in | Wt 192.0 lb

## 2024-02-18 DIAGNOSIS — F419 Anxiety disorder, unspecified: Secondary | ICD-10-CM

## 2024-02-18 DIAGNOSIS — G894 Chronic pain syndrome: Secondary | ICD-10-CM

## 2024-02-18 DIAGNOSIS — L409 Psoriasis, unspecified: Secondary | ICD-10-CM

## 2024-02-18 DIAGNOSIS — E66811 Obesity, class 1: Secondary | ICD-10-CM

## 2024-02-18 DIAGNOSIS — H918X3 Other specified hearing loss, bilateral: Secondary | ICD-10-CM

## 2024-02-18 DIAGNOSIS — F32A Depression, unspecified: Secondary | ICD-10-CM

## 2024-02-18 DIAGNOSIS — E782 Mixed hyperlipidemia: Secondary | ICD-10-CM

## 2024-02-18 DIAGNOSIS — Z599 Problem related to housing and economic circumstances, unspecified: Secondary | ICD-10-CM

## 2024-02-18 DIAGNOSIS — Z6831 Body mass index (BMI) 31.0-31.9, adult: Secondary | ICD-10-CM

## 2024-02-18 DIAGNOSIS — R7303 Prediabetes: Secondary | ICD-10-CM

## 2024-02-18 DIAGNOSIS — J3089 Other allergic rhinitis: Secondary | ICD-10-CM

## 2024-02-18 DIAGNOSIS — E6609 Other obesity due to excess calories: Secondary | ICD-10-CM

## 2024-02-28 ENCOUNTER — Telehealth: Payer: Self-pay | Admitting: Licensed Clinical Social Worker

## 2024-02-28 ENCOUNTER — Ambulatory Visit (INDEPENDENT_AMBULATORY_CARE_PROVIDER_SITE_OTHER): Admitting: Licensed Clinical Social Worker

## 2024-02-28 DIAGNOSIS — Z91199 Patient's noncompliance with other medical treatment and regimen due to unspecified reason: Secondary | ICD-10-CM

## 2024-02-28 NOTE — Telephone Encounter (Signed)
 Discussed cancelled appt--pt will call practice back to reschedule at a later date.

## 2024-02-28 NOTE — BH Specialist Note (Signed)
   Same day cancel

## 2024-04-19 ENCOUNTER — Encounter: Payer: Self-pay | Admitting: Physician Assistant

## 2024-04-19 ENCOUNTER — Ambulatory Visit (INDEPENDENT_AMBULATORY_CARE_PROVIDER_SITE_OTHER): Admitting: Physician Assistant

## 2024-04-19 VITALS — BP 149/58 | HR 66 | Resp 14 | Ht 66.0 in | Wt 193.0 lb

## 2024-04-19 DIAGNOSIS — F339 Major depressive disorder, recurrent, unspecified: Secondary | ICD-10-CM

## 2024-04-19 DIAGNOSIS — L989 Disorder of the skin and subcutaneous tissue, unspecified: Secondary | ICD-10-CM

## 2024-04-19 DIAGNOSIS — E6609 Other obesity due to excess calories: Secondary | ICD-10-CM

## 2024-04-19 DIAGNOSIS — J3089 Other allergic rhinitis: Secondary | ICD-10-CM | POA: Diagnosis not present

## 2024-04-19 DIAGNOSIS — E66811 Obesity, class 1: Secondary | ICD-10-CM | POA: Diagnosis not present

## 2024-04-19 DIAGNOSIS — H6192 Disorder of left external ear, unspecified: Secondary | ICD-10-CM

## 2024-04-19 DIAGNOSIS — L409 Psoriasis, unspecified: Secondary | ICD-10-CM | POA: Diagnosis not present

## 2024-04-19 DIAGNOSIS — I10 Essential (primary) hypertension: Secondary | ICD-10-CM

## 2024-04-19 DIAGNOSIS — Z6831 Body mass index (BMI) 31.0-31.9, adult: Secondary | ICD-10-CM

## 2024-04-20 DIAGNOSIS — H6192 Disorder of left external ear, unspecified: Secondary | ICD-10-CM | POA: Insufficient documentation

## 2024-04-20 DIAGNOSIS — L989 Disorder of the skin and subcutaneous tissue, unspecified: Secondary | ICD-10-CM | POA: Insufficient documentation

## 2024-04-20 DIAGNOSIS — J309 Allergic rhinitis, unspecified: Secondary | ICD-10-CM | POA: Insufficient documentation

## 2024-04-20 NOTE — Progress Notes (Signed)
 Established patient visit  Patient: Suzanne [name]   DOB: July 13, 1955   68 y.o. Female  MRN: [id number] Visit Date: 04/19/2024  Today's healthcare provider: Jolynn Spencer, PA-C   Chief Complaint  Patient presents with   Medical Management of Chronic Issues    2 month f/u   Ear Problem    L ear wart. Does not want to go to [facility].    Skin Problem    Dermatology removal and return R thumb high sensitivity   Subjective     Discussed the use of AI scribe software for clinical note transcription with the patient, who gave verbal consent to proceed.  History of Present Illness Suzanne [name] is a 68 year old female who presents with dermatological concerns and follow-up for anxiety and depression.  She has painful lesions on her ears and a wart-like lesion on her right thumb. Prior laser therapy twice and one excision did not resolve the problem. Pain interferes with activities like gardening and household chores.  She reports anxiety and depression with fatigue and loneliness, which worsen around the holidays and with family stress, including a strained relationship with her son. She feels prior visits with Christina Husami were helpful.  She notes recent weight loss of about fourteen pounds with partial regain of four pounds, which she links to stress, sleep changes, and possible water retention. Her intake is often coffee and a smoothie in the morning and a larger meal later in the day.  She has recurrent ear issues that improve with ear drops. She associates flares with allergies and has had more sneezing recently.  She is up to date on flu and pneumonia vaccines. She has not yet received the current COVID-19 booster and previously had only a sore arm and mild fatigue from COVID-19 vaccines.       06/01/2022    3:05 PM 05/07/2022   10:44 AM 04/07/2022    2:15 PM  Depression screen PHQ 2/9  Decreased Interest 0 0 1  Down, Depressed, Hopeless 1 2 1   PHQ -  2 Score 1 2 2   Altered sleeping 3 3 3   Tired, decreased energy 0 3 2  Change in appetite 0 3 3  Feeling bad or failure about yourself  0 0 2  Trouble concentrating 0 0 2  Moving slowly or fidgety/restless 0 0 0  Suicidal thoughts 0 0 0  PHQ-9 Score 4  11  14    Difficult doing work/chores Not difficult at all Somewhat difficult Somewhat difficult     Data saved with a previous flowsheet row definition   Medications: Outpatient Medications Prior to Visit  Medication Sig   gabapentin  (NEURONTIN ) 300 MG capsule Take 1 capsule (300 mg total) by mouth at bedtime.   traZODone  (DESYREL ) 50 MG tablet Take 0.5-1 tablets (25-50 mg total) by mouth at bedtime as needed for sleep.   No facility-administered medications prior to visit.    Review of Systems All negative Except see HPI       Objective    BP (!) 149/58   Pulse 66   Resp 14   Ht 5' 6 (1.676 m)   Wt 193 lb (87.5 kg)   SpO2 98%   BMI 31.15 kg/m     Physical Exam Vitals reviewed.  Constitutional:      General: She is not in acute distress.    Appearance: Normal appearance. She is well-developed. She is not diaphoretic.  HENT:     Head: Normocephalic and  atraumatic.  Eyes:     General: No scleral icterus.    Conjunctiva/sclera: Conjunctivae normal.  Neck:     Thyroid : No thyromegaly.  Cardiovascular:     Rate and Rhythm: Normal rate and regular rhythm.     Pulses: Normal pulses.     Heart sounds: Normal heart sounds. No murmur heard. Pulmonary:     Effort: Pulmonary effort is normal. No respiratory distress.     Breath sounds: Normal breath sounds. No wheezing, rhonchi or rales.  Musculoskeletal:     Cervical back: Neck supple.     Right lower leg: No edema.     Left lower leg: No edema.  Lymphadenopathy:     Cervical: No cervical adenopathy.  Skin:    General: Skin is warm and dry.     Findings: No rash.  Neurological:     Mental Status: She is alert and oriented to person, place, and time. Mental  status is at baseline.  Psychiatric:        Mood and Affect: Mood normal.        Behavior: Behavior normal.      No results found for any visits on 04/19/24.      Assessment & Plan Skin lesions of right ear and right thumb Persistent lesions unresponsive to previous treatments, causing pain and functional interference. - Referred to dermatology in [facility] for evaluation and management.  Psoriasis Chronic and stable Recent flare-up resolved with topical treatments. - referral to dermatology was placed Will follow-up  Depression Chronic and stable Ongoing depression with recent stressors. Previous therapy beneficial. - Encouraged continuation of therapy with Christina Hussami. - Encouraged engagement in enjoyable activities. Will repeat pqh9 and gad7 at the follow-up Will follow-up  Allergic rhinitis Chronic and stable Symptoms likely due to environmental allergens, improved with prescribed drops. - Continue use of prescribed drops as needed.  Obesity/Weight fluctuation Chronic and stable Body mass index is 31.15 kg/m. Recent weight changes possibly due to stress, sleep, and diet. Current diet low in calories. - Encouraged balanced diet with regular meals. Will follow-up  General Health Maintenance Flu, pneumonia, and shingles vaccinations up to date. COVID-19 vaccination pending. - Consider COVID-19 vaccination based on personal preference and risk assessment.  Hypertension Chronic, unstable Could be due to white coat syndrome BP was 149/58 Not taking any medication, has been taking Zestoretic  in the past Advised to measure her bp at home Bring BP log and BP devise to her next appointment Will follow-up  Hyperlipidemia Chronic and previously unstable LDL was 148 The 10-year ASCVD risk score (Arnett DK, et al., 2019) is: 8% Consider starting a cholesterol medication Continue low cholesterol diet and daily exercise Will  follow-up  Prediabetes Chronic A1c was 5.7, not on medication Continue low carb diet and regular exercise Will follow-up  Vit D insufficiency Last vit d was 31.2 Advised to continue OTC vit D3 Will follow-up  Will need to proceed with labwork at the follow-up: lp. Cmp. A1c. Vit D  No orders of the defined types were placed in this encounter.   No follow-ups on file.   The patient was advised to call back or seek an in-person evaluation if the symptoms worsen or if the condition fails to improve as anticipated.  I discussed the assessment and treatment plan with the patient. The patient was provided an opportunity to ask questions and all were answered. The patient agreed with the plan and demonstrated an understanding of the instructions.  I, Myrick Mcnairy, PA-C have  reviewed all documentation for this visit. The documentation on 04/19/2024  for the exam, diagnosis, procedures, and orders are all accurate and complete.  Jolynn Spencer, [facility], MMS [facility] [phone] (phone) [phone] (fax)  [facility]

## 2024-04-21 ENCOUNTER — Telehealth: Payer: Self-pay

## 2024-06-16 ENCOUNTER — Other Ambulatory Visit: Payer: Self-pay | Admitting: Family Medicine

## 2024-06-16 DIAGNOSIS — G2581 Restless legs syndrome: Secondary | ICD-10-CM

## 2024-06-16 DIAGNOSIS — G894 Chronic pain syndrome: Secondary | ICD-10-CM

## 2024-07-21 ENCOUNTER — Ambulatory Visit: Admitting: Physician Assistant
